# Patient Record
Sex: Male | Born: 1975
Health system: Southern US, Community
[De-identification: ages and names within clinical notes are randomized; demographics above are authoritative.]

## PROBLEM LIST (undated history)

## (undated) DIAGNOSIS — F909 Attention-deficit hyperactivity disorder, unspecified type: Secondary | ICD-10-CM

## (undated) HISTORY — PX: WISDOM TOOTH EXTRACTION: SHX21

## (undated) HISTORY — DX: Attention-deficit hyperactivity disorder, unspecified type: F90.9

---

## 2007-07-11 HISTORY — PX: REPAIR TENDONS FOOT: SUR1209

## 2007-08-09 ENCOUNTER — Emergency Department (HOSPITAL_COMMUNITY): Admission: EM | Admit: 2007-08-09 | Discharge: 2007-08-09 | Payer: Self-pay | Admitting: Emergency Medicine

## 2007-09-17 ENCOUNTER — Ambulatory Visit (HOSPITAL_BASED_OUTPATIENT_CLINIC_OR_DEPARTMENT_OTHER): Admission: RE | Admit: 2007-09-17 | Discharge: 2007-09-17 | Payer: Self-pay | Admitting: Orthopedic Surgery

## 2009-02-15 ENCOUNTER — Ambulatory Visit: Payer: Self-pay | Admitting: Family Medicine

## 2009-02-15 LAB — CONVERTED CEMR LAB
Alkaline Phosphatase: 65 units/L (ref 39–117)
BUN: 11 mg/dL (ref 6–23)
Basophils Absolute: 0 10*3/uL (ref 0.0–0.1)
Basophils Relative: 0.2 % (ref 0.0–3.0)
Bilirubin, Direct: 0.2 mg/dL (ref 0.0–0.3)
CO2: 30 meq/L (ref 19–32)
Calcium: 9.2 mg/dL (ref 8.4–10.5)
Chloride: 108 meq/L (ref 96–112)
Cholesterol: 205 mg/dL — ABNORMAL HIGH (ref 0–200)
Creatinine, Ser: 1.2 mg/dL (ref 0.4–1.5)
Direct LDL: 148.1 mg/dL
Eosinophils Absolute: 0.1 10*3/uL (ref 0.0–0.7)
Lymphocytes Relative: 36.8 % (ref 12.0–46.0)
MCHC: 34.5 g/dL (ref 30.0–36.0)
MCV: 89.8 fL (ref 78.0–100.0)
Monocytes Absolute: 0.5 10*3/uL (ref 0.1–1.0)
Neutrophils Relative %: 50.6 % (ref 43.0–77.0)
Platelets: 183 10*3/uL (ref 150.0–400.0)
RBC: 5.32 M/uL (ref 4.22–5.81)
RDW: 12.4 % (ref 11.5–14.6)
Total Bilirubin: 1.4 mg/dL — ABNORMAL HIGH (ref 0.3–1.2)
Total CHOL/HDL Ratio: 6
Total Protein: 7.2 g/dL (ref 6.0–8.3)
Triglycerides: 144 mg/dL (ref 0.0–149.0)
VLDL: 28.8 mg/dL (ref 0.0–40.0)

## 2009-03-29 ENCOUNTER — Ambulatory Visit: Payer: Self-pay | Admitting: Family Medicine

## 2009-05-13 ENCOUNTER — Ambulatory Visit: Payer: Self-pay | Admitting: Family Medicine

## 2009-06-22 IMAGING — CR DG ANKLE COMPLETE 3+V*L*
3 series · 3 of 3 positions shown · non-contrast
Comparison: none

CLINICAL DATA: Running. Left ankle injury.  Pain laterally. 
 LEFT ANKLE ? 3 VIEW:

[t ankle joint ap left (1 of 2)]
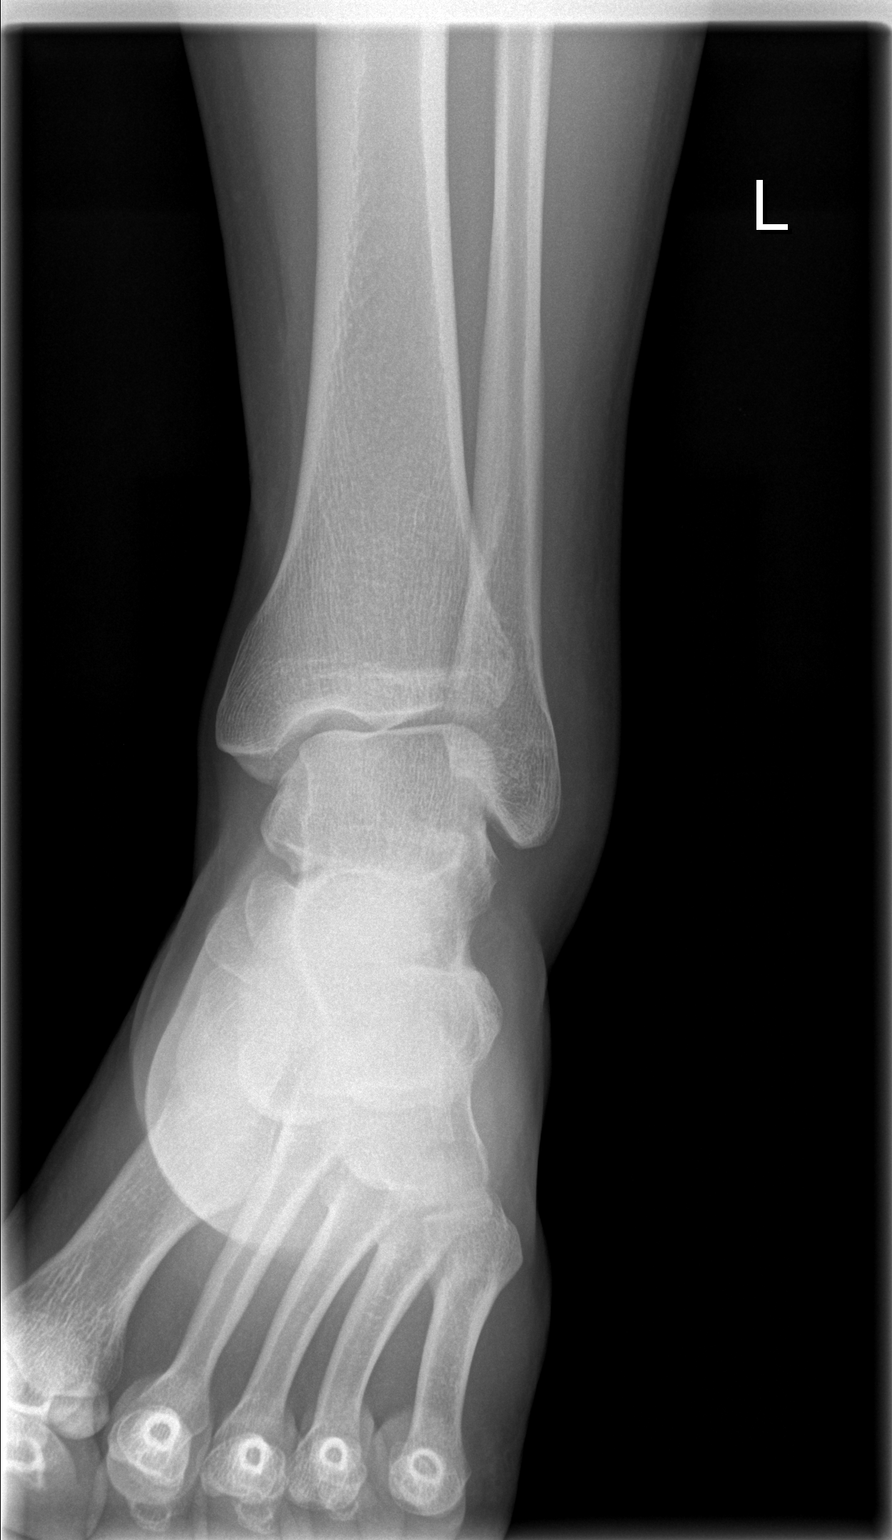

[t ankle joint ap left (2 of 2)]
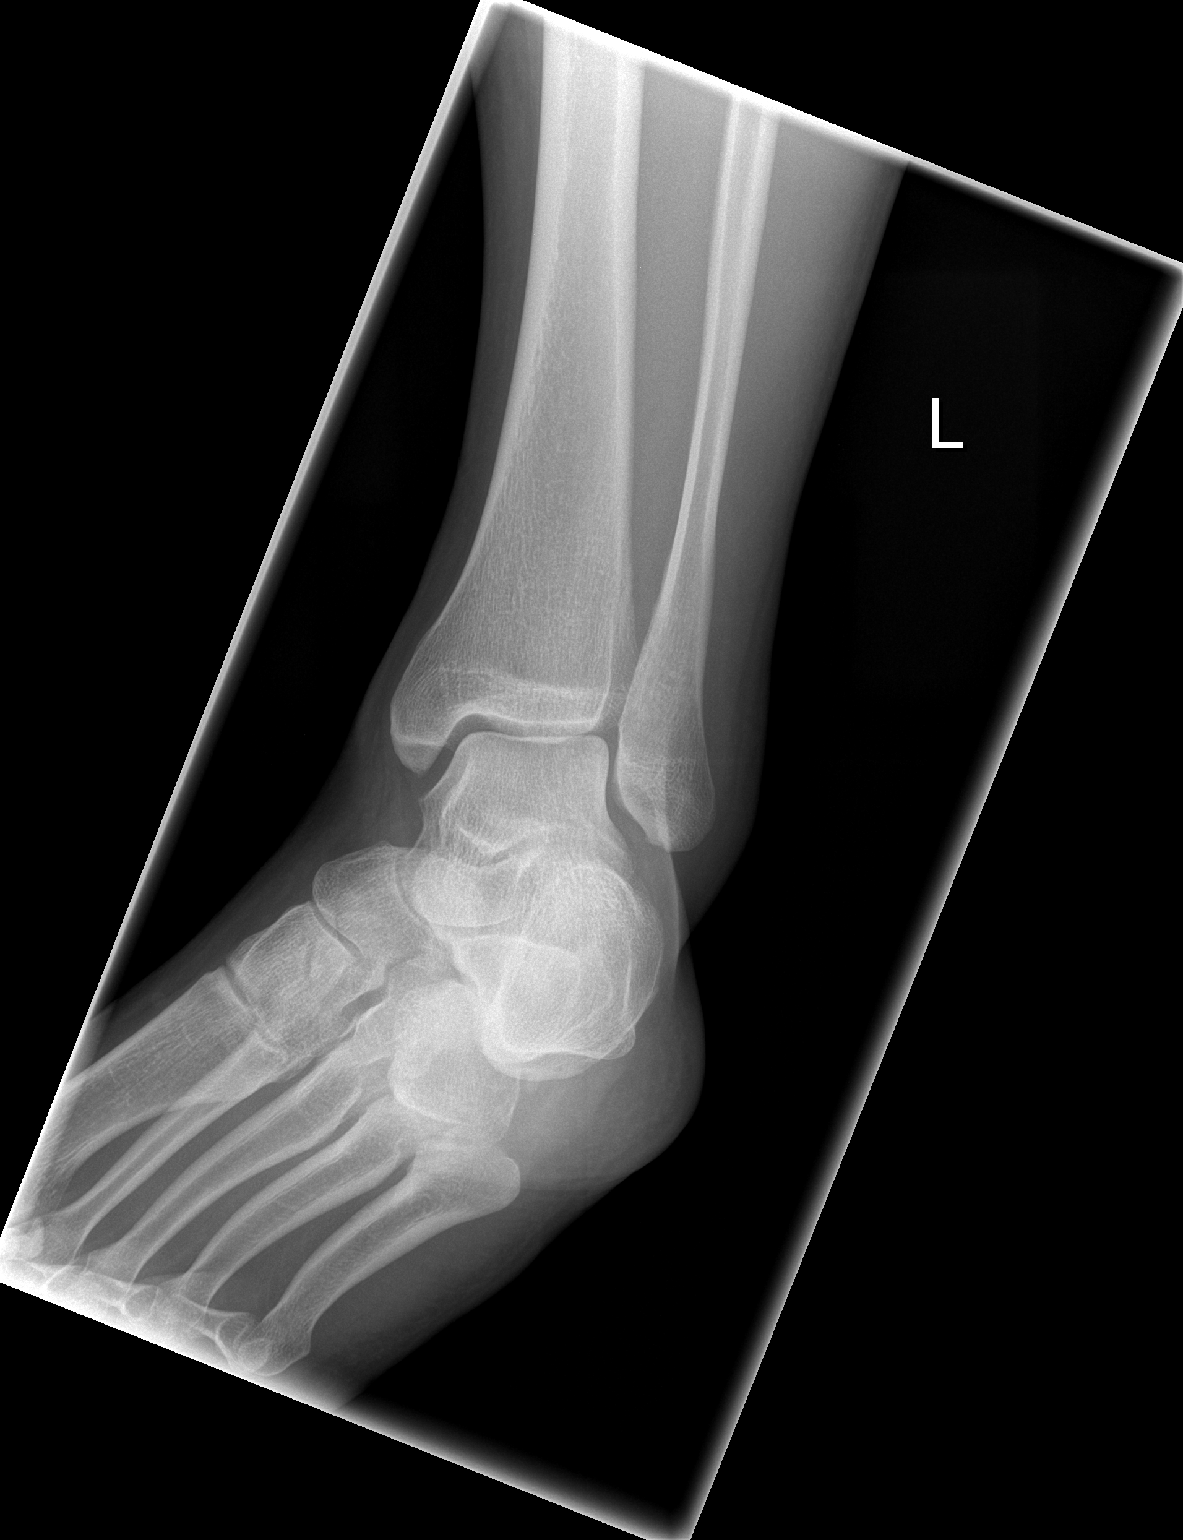

[t ankle joint lat left]
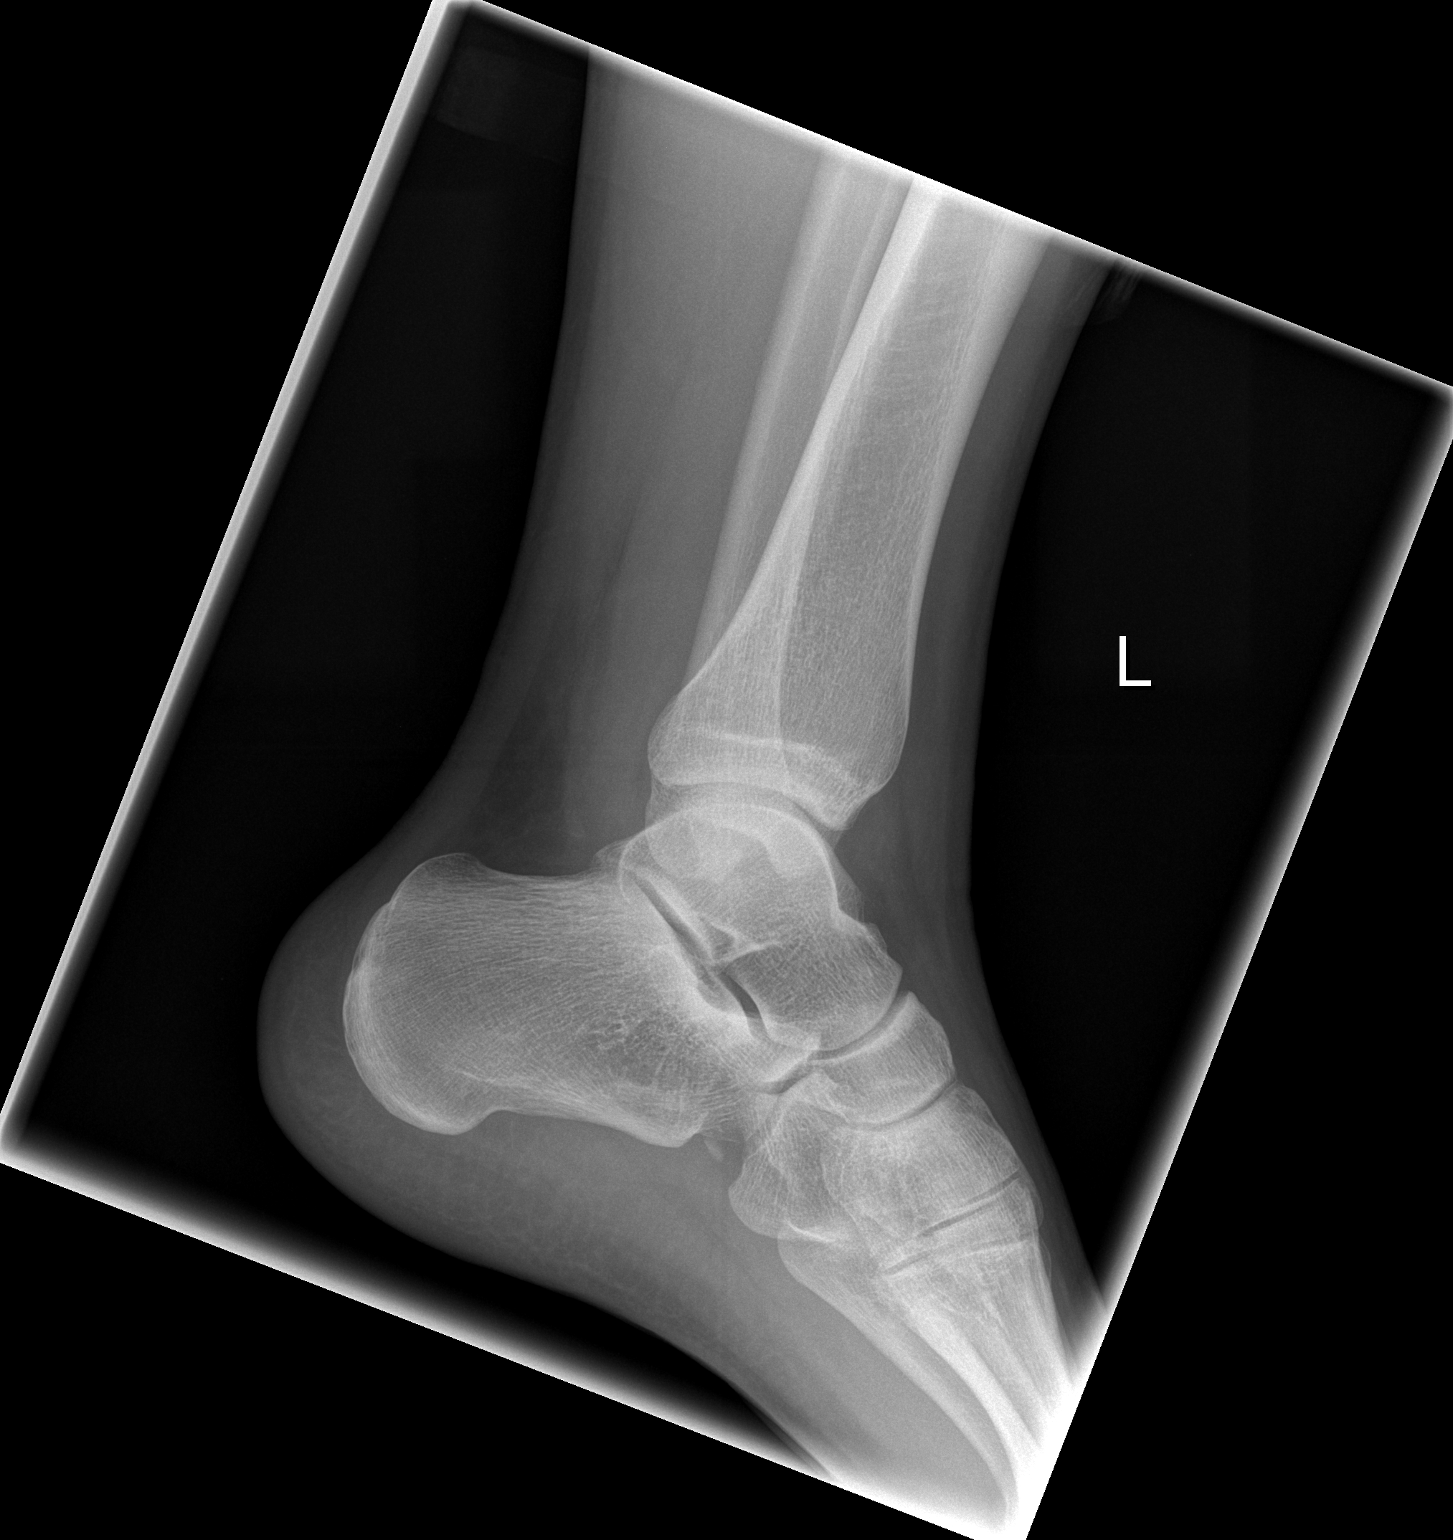

[3 of 3 positions shown; findings below may reference images not displayed]

FINDINGS: Soft tissue swelling laterally.  No fracture or dislocation. No radiopaque foreign body. Bones of the proximal foot appear intact as well.
IMPRESSION: Lateral soft tissue swelling without apparent acute bony injury.

## 2009-07-22 ENCOUNTER — Ambulatory Visit: Payer: Self-pay | Admitting: Family Medicine

## 2009-07-22 DIAGNOSIS — J029 Acute pharyngitis, unspecified: Secondary | ICD-10-CM

## 2010-08-09 NOTE — Assessment & Plan Note (Signed)
Summary: medicine for sore throat/cjr   Vital Signs:  Patient profile:   35 year old male Temp:     98.2 degrees F oral BP sitting:   118 / 82  (left arm) Cuff size:   regular  Vitals Entered By: Sid Falcon LPN (July 22, 2009 10:06 AM) CC: Sore throat, body aches X 2 days   History of Present Illness: Acute visit. Sore throat which started yesterday. Diffuse body aches intermittent headaches. Minimal nasal congestion. No cough. Severe sore throat symptoms slightly improved with Advil and Tylenol. No ill exposures. No known drug allergies.  Allergies: No Known Drug Allergies PMH reviewed for relevance  Review of Systems      See HPI  Physical Exam  General:  Well-developed,well-nourished,in no acute distress; alert,appropriate and cooperative throughout examination Ears:  External ear exam shows no significant lesions or deformities.  Otoscopic examination reveals clear canals, tympanic membranes are intact bilaterally without bulging, retraction, inflammation or discharge. Hearing is grossly normal bilaterally. Nose:  External nasal examination shows no deformity or inflammation. Nasal mucosa are pink and moist without lesions or exudates. Mouth:  extremely red erythematous posterior pharynx with some satellite type erythema. No exudate. Neck:  supple with tender anterior cervical nodes bilaterally Lungs:  Normal respiratory effort, chest expands symmetrically. Lungs are clear to auscultation, no crackles or wheezes. Heart:  Normal rate and regular rhythm. S1 and S2 normal without gallop, murmur, click, rub or other extra sounds. Skin:  no rash   Impression & Recommendations:  Problem # 1:  SORE THROAT (ICD-462) rapid strep negative but questionable reliability of swab.  Clinically this looks very suspicious for strep. We'll go ahead and treated with amoxicillin. His updated medication list for this problem includes:    Naproxen 500 Mg Tabs (Naproxen) ..... One by mouth  two times a day as needed pain    Amoxicillin 875 Mg Tabs (Amoxicillin) ..... One by mouth two times a day for 10 days  Orders: Rapid Strep ([address])  Complete Medication List: 1)  Multi Vitamin Mens Tabs (Multiple vitamin) .... Once daily 2)  Naproxen 500 Mg Tabs (Naproxen) .... One by mouth two times a day as needed pain 3)  Amoxicillin 875 Mg Tabs (Amoxicillin) .... One by mouth two times a day for 10 days  Patient Instructions: 1)  Drink lots of fluids. Continue Tylenol or Advil for symptom relief. Prescriptions: AMOXICILLIN 875 MG TABS (AMOXICILLIN) one by mouth two times a day for 10 days  #20 x 0   Entered and Authorized by:   Evelena Peat MD   Signed by:   Evelena Peat MD on 07/22/2009   Method used:   Electronically to        [facility]  [address] [phone]* (retail)       [address]       [place], [place]  [address]       Ph: [phone]       Fax: [phone]   RxID:   [id number]

## 2010-09-26 ENCOUNTER — Telehealth: Payer: Self-pay | Admitting: *Deleted

## 2010-09-26 NOTE — Telephone Encounter (Signed)
Chart reviewed. Dr. Caryl Never pt?

## 2010-09-26 NOTE — Telephone Encounter (Signed)
Pt is asking for a MD to treat his ADD.  Do yo want to do this or refer him?

## 2010-09-26 NOTE — Telephone Encounter (Signed)
THIS IS NOT DR Lovell Sheehan PT

## 2010-11-22 NOTE — Op Note (Signed)
NAMEJAPHET, Kyle Scott NO.:  [id number]   MEDICAL RECORD NO.:  [id number]          PATIENT TYPE:  AMB   LOCATION:  DSC                          FACILITY:  [facility]   PHYSICIAN:  Leonides Grills, M.D.     DATE OF BIRTH:  1975/11/09   DATE OF PROCEDURE:  09/17/2007  DATE OF DISCHARGE:                               OPERATIVE REPORT   PREOPERATIVE DIAGNOSIS:  Left subluxing peroneal tendon.   POSTOPERATIVE DIAGNOSIS:  Left subluxing peroneal tendons.   OPERATION:  Repair left subluxing peroneal tendon without fibular  osteotomy   ANESTHESIA:  General.   SURGEON:  Leonides Grills, M.D.   ASSISTANT:  Richardean Canal, P.A.   ESTIMATED BLOOD LOSS:  Minimal.   TOURNIQUET TIME:  Approximately one hour.   COMPLICATIONS:  None.   DISPOSITION:  Stable to PR.   INDICATIONS:  This is a 35 year old gentleman who sustained the above  injury.  He was consented for the above procedure.  All risks including  infection, neurovascular injury, recurrence of subluxation, persistent  pain, tenosynovitis, stiffness, scar tissue formation and prolonged  recovery were all explained; questions were encouraged and answered.   OPERATION:  The patient was brought to the operating room and placed in  the supine position, after adequate general endotracheal tube anesthesia  was administered, as well as Ancef 1 gram IV piggyback.  The patient was  then placed in the lateral decubitus position with the operative side up  on a beanbag.  An axillary roll was placed.  All bony prominences were  well padded The left lower extremity was then prepped and draped in the  sterile manner, with a proximally placed thigh tourniquet.  The limb was  gravity exsanguinated.  The tourniquet was elevated to 290 mmHg.  A  curvilinear incision over the lateral malleolus was then made.  Dissection was carried down through skin.  Hemostasis was obtained.  The  peroneus longus tendon was relocated digitally, and the  attenuated  peroneal retinaculum was then incised.  This was then reflected and the  tendons were inspected.  There was no tear of the peroneus brevis or  longus tendon.  There was a large amount of synovitis in this area and  scar tissue as well.  The peroneus brevis muscle belly was digitalized,  and this was debulked and debrided as well.   Once the formal tenosynovectomy was performed and the peroneus brevis  muscle belly was debulked, we then inspected the posterior aspect the  lateral malleolus.  There was then adequate concavity to this area.  It  was because of this we did not perform a fibular osteotomy.  We then  created a trough with a curved 0.25-inch osteotome, followed by a  rongeur on the posterolateral edge of the lateral malleolus.  Once this  was done, we then used a 2-mm drill and drilled multiple drill holes  over the lateral edge and into this trough as well.  We then used 2-0  FiberWire stitch and used multiple stitches to reapproximate and repair  the peroneal retinaculum into this trough  area.  Once this was done, and  the repair was beautifully done in this area and had an excellent bed  and tension throughout, we then repaired the peroneal retinaculum edge  to the free edge that was slightly anterior to this with 3-0 Vicryl  stitch.  Range of motion in the ankle was excellent.  The peroneal  tendon excursion was excellent as well.  There were no areas where the  tendon was bound up within the suture; for the tendons were protected  throughout the repair.   Prior to doing the retinacular closure, that area was copiously  irrigated normal saline.  The tourniquet was deflated at the end of  procedure and hemostasis was obtained.  The area was copiously irrigated  once again with normal saline.  The subcutaneous was closed with 3-0  Vicryl.  The skin was closed with 4-0 nylon.  Sterile dressing was  applied.  Modified Jones dressing was applied.  The patient was  stable  to PR.      Leonides Grills, M.D.  Electronically Signed     PB/MEDQ  D:  09/17/2007  T:  09/18/2007  Job:  [id number]

## 2011-04-03 LAB — POCT HEMOGLOBIN-HEMACUE: Hemoglobin: 16.6

## 2014-12-18 ENCOUNTER — Ambulatory Visit: Payer: Self-pay | Admitting: Family Medicine

## 2014-12-21 ENCOUNTER — Other Ambulatory Visit (HOSPITAL_COMMUNITY): Payer: BLUE CROSS/BLUE SHIELD

## 2015-03-15 ENCOUNTER — Encounter (HOSPITAL_COMMUNITY): Payer: Self-pay | Admitting: Vascular Surgery

## 2015-03-15 ENCOUNTER — Emergency Department (HOSPITAL_COMMUNITY)
Admission: EM | Admit: 2015-03-15 | Discharge: 2015-03-16 | Disposition: A | Discharge status: Home / Self Care | Payer: BLUE CROSS/BLUE SHIELD | Attending: Emergency Medicine | Admitting: Emergency Medicine

## 2015-03-15 DIAGNOSIS — R1031 Right lower quadrant pain: Secondary | ICD-10-CM | POA: Diagnosis present

## 2015-03-15 DIAGNOSIS — R110 Nausea: Secondary | ICD-10-CM | POA: Insufficient documentation

## 2015-03-15 LAB — CBC
HCT: 45.6 % (ref 39.0–52.0)
HEMOGLOBIN: 15.9 g/dL (ref 13.0–17.0)
MCH: 30.6 pg (ref 26.0–34.0)
MCHC: 34.9 g/dL (ref 30.0–36.0)
MCV: 87.7 fL (ref 78.0–100.0)
Platelets: 205 10*3/uL (ref 150–400)
RBC: 5.2 MIL/uL (ref 4.22–5.81)
RDW: 12.6 % (ref 11.5–15.5)
WBC: 7.1 10*3/uL (ref 4.0–10.5)

## 2015-03-15 LAB — URINALYSIS, ROUTINE W REFLEX MICROSCOPIC
Bilirubin Urine: NEGATIVE
GLUCOSE, UA: NEGATIVE mg/dL
HGB URINE DIPSTICK: NEGATIVE
Ketones, ur: NEGATIVE mg/dL
LEUKOCYTES UA: NEGATIVE
Nitrite: NEGATIVE
PH: 5 (ref 5.0–8.0)
Protein, ur: NEGATIVE mg/dL
Specific Gravity, Urine: 1.033 — ABNORMAL HIGH (ref 1.005–1.030)
Urobilinogen, UA: 0.2 mg/dL (ref 0.0–1.0)

## 2015-03-15 LAB — COMPREHENSIVE METABOLIC PANEL
ALBUMIN: 4.4 g/dL (ref 3.5–5.0)
ALK PHOS: 66 U/L (ref 38–126)
ALT: 48 U/L (ref 17–63)
ANION GAP: 9 (ref 5–15)
AST: 42 U/L — ABNORMAL HIGH (ref 15–41)
BUN: 21 mg/dL — ABNORMAL HIGH (ref 6–20)
CALCIUM: 9.3 mg/dL (ref 8.9–10.3)
CHLORIDE: 104 mmol/L (ref 101–111)
CO2: 25 mmol/L (ref 22–32)
CREATININE: 1.24 mg/dL (ref 0.61–1.24)
GFR calc non Af Amer: >60 mL/min (ref >60)
GLUCOSE: 114 mg/dL — AB (ref 65–99)
Potassium: 4.1 mmol/L (ref 3.5–5.1)
SODIUM: 138 mmol/L (ref 135–145)
Total Bilirubin: 0.9 mg/dL (ref 0.3–1.2)
Total Protein: 7.3 g/dL (ref 6.5–8.1)

## 2015-03-15 MED ORDER — SODIUM CHLORIDE 0.9 % IV BOLUS (SEPSIS)
1000.0000 mL | Freq: Once | INTRAVENOUS | Status: AC
  Administered 2015-03-16: 1000 mL via INTRAVENOUS

## 2015-03-15 NOTE — ED Notes (Signed)
Pt reports to the ED for eval of RLQ abd pain. He reports he had this pain this am and then it resolved and then it came back tonight. Pt denies nausea but no V/D, fevers, chills, or urinary symptoms. Pt has not had any abd surgeries. Pt A&Ox4, resp e/u, and skin warm and dry.

## 2015-03-15 NOTE — ED Provider Notes (Signed)
TIME SEEN: 11:31 PM   CHIEF COMPLAINT: Abdominal Pain   HPI: Kyle Scott is a 39 y.o. male who presents to the Emergency Department complaining of sharp, intermittent, worsening, non-radiating RLQ abdominal pain onset 8 AM this morning, and then again at 8:00 PM tonight. He states he is not currently in pain. He reports associated nausea. He notes mild relief of the pain with curling up into fetal position. He has no other medical problems. He denies any previous abdominal surgery. Pt denies fever, vomiting, diarrhea, urinary symptoms, bloody stool, and penile discharge, testicular pain or swelling. Reports symptoms have completely resolved upon arrival to the emergency department.   Pt has NKDA.  ROS: See HPI Constitutional: no fever  Eyes: no drainage  ENT: no runny nose   Cardiovascular:  no chest pain  Resp: no SOB  GI: no vomiting GU: no dysuria Integumentary: no rash  Allergy: no hives  Musculoskeletal: no leg swelling  Neurological: no slurred speech ROS otherwise negative  PAST MEDICAL HISTORY/PAST SURGICAL HISTORY:  History reviewed. No pertinent past medical history.  MEDICATIONS:  Prior to Admission medications   Not on File    ALLERGIES:  No Known Allergies  SOCIAL HISTORY:  Social History  Substance Use Topics  . Smoking status: Never Smoker   . Smokeless tobacco: Never Used  . Alcohol Use: No    FAMILY HISTORY: No family history on file.  EXAM: BP 131/75 mmHg  Pulse 83  Temp(Src) 97.9 F (36.6 C) (Oral)  Resp 16  Ht  (1.753 m)  Wt 201 lb 4.8 oz (91.309 kg)  BMI 29.71 kg/m2  SpO2 100% CONSTITUTIONAL: Alert and oriented and responds appropriately to questions. Well-appearing; well-nourished HEAD: Normocephalic EYES: Conjunctivae clear, PERRL ENT: normal nose; no rhinorrhea; moist mucous membranes; pharynx without lesions noted NECK: Supple, no meningismus, no LAD  CARD: RRR; S1 and S2 appreciated; no murmurs, no clicks, no rubs, no  gallops RESP: Normal chest excursion without splinting or tachypnea; breath sounds clear and equal bilaterally; no wheezes, no rhonchi, no rales, no hypoxia or respiratory distress, speaking full sentences ABD/GI: Normal bowel sounds; non-distended; soft, tender at McBurney's point with voluntary guarding, negative Murphy's sign, no rebound, no peritoneal signs BACK:  The back appears normal and is non-tender to palpation, there is no CVA tenderness EXT: Normal ROM in all joints; non-tender to palpation; no edema; normal capillary refill; no cyanosis, no calf tenderness or swelling    SKIN: Normal color for age and race; warm NEURO: Moves all extremities equally, sensation to light touch intact diffusely, cranial nerves II through XII intact PSYCH: The patient's mood and manner are appropriate. Grooming and personal hygiene are appropriate.  MEDICAL DECISION MAKING: Patient here with right lower quadrant pain. Pain is been intermittent. He denies being currently but does have some tenderness with palpation at McBurney's point. Discussed with patient that a suspicion for this being appendicitis was low but he states this was his largest concern. States that he had a family member with symptoms similar to his and that is why he came to the emergency department. No history of kidney stones. Labs are unremarkable including no leukocytosis. No fever. Urine shows no sign of infection or blood. Given patient's tenderness at McBurney's point with guarding will obtain CT scan for further evaluation. He declines any pain medication at this time.  ED PROGRESS: CT scan shows no acute abnormality. Appendix appears normal. He does have a mass in the lateral segment of the left  lobe of the liver that is consistent with a cavernous hemangioma. He is still pain-free. Able to drink without difficulty. I feel he is safe to be discharged home without further emergent workup. Discussed with patient unclear etiology for his  pain but if it returns and is more persistent or he has fever, persistent vomiting, blood in his stool, black and tarry stools to return to the emergency department. He does have a PCP for follow-up. He verbalizes understanding and is comfortable with this plan.   I personally performed the services described in this documentation, which was scribed in my presence. The recorded information has been reviewed and is accurate.   Layla Maw Issai Werling, DO 03/16/15 [phone]

## 2015-03-16 ENCOUNTER — Emergency Department (HOSPITAL_COMMUNITY): Payer: BLUE CROSS/BLUE SHIELD

## 2015-03-16 ENCOUNTER — Encounter (HOSPITAL_COMMUNITY): Payer: Self-pay | Admitting: Radiology

## 2015-03-16 MED ORDER — IOHEXOL 300 MG/ML  SOLN
100.0000 mL | Freq: Once | INTRAMUSCULAR | Status: AC | PRN
  Administered 2015-03-16: 100 mL via INTRAVENOUS

## 2015-03-16 NOTE — Discharge Instructions (Signed)

## 2016-05-23 ENCOUNTER — Ambulatory Visit (INDEPENDENT_AMBULATORY_CARE_PROVIDER_SITE_OTHER): Payer: BLUE CROSS/BLUE SHIELD | Admitting: Family Medicine

## 2016-05-23 ENCOUNTER — Encounter: Payer: Self-pay | Admitting: Family Medicine

## 2016-05-23 VITALS — BP 132/78 | HR 104 | Temp 98.2°F | Ht 69.0 in | Wt 197.0 lb

## 2016-05-23 DIAGNOSIS — Z79899 Other long term (current) drug therapy: Secondary | ICD-10-CM | POA: Diagnosis not present

## 2016-05-23 DIAGNOSIS — Z23 Encounter for immunization: Secondary | ICD-10-CM

## 2016-05-23 NOTE — Progress Notes (Signed)
Subjective:     Patient ID: Kyle LennertJared M Kinner, male   DOB: 08/23/1975, 40 y.o.   MRN: [id number]  HPI Patient is seen to reestablish care. He has not been seen in this practice now on over 3 years. He is followed by psychiatry and currently takes Vyvanse for adult ADD. His psychiatrist had suggested that he get an EKG though he's had no recent chest pains or exertional symptoms or dyspnea whatsoever. No family history of premature CAD. Patient is nonsmoker. He has noted occasional palpitations when taking Vyvanse but these are infrequent.  No past medical history on file. Past Surgical History:  Procedure Laterality Date  . REPAIR TENDONS FOOT Left 2009  . WISDOM TOOTH EXTRACTION      reports that he has never smoked. He has never used smokeless tobacco. He reports that he does not drink alcohol or use drugs. family history is not on file. No Known Allergies   Review of Systems  Constitutional: Negative for fatigue.  Eyes: Negative for visual disturbance.  Respiratory: Negative for cough, chest tightness and shortness of breath.   Cardiovascular: Negative for chest pain, palpitations and leg swelling.  Neurological: Negative for dizziness, syncope, weakness, light-headedness and headaches.       Objective:   Physical Exam  Constitutional: He is oriented to person, place, and time. He appears well-developed and well-nourished.  HENT:  Right Ear: External ear normal.  Left Ear: External ear normal.  Mouth/Throat: Oropharynx is clear and moist.  Eyes: Pupils are equal, round, and reactive to light.  Neck: Neck supple. No thyromegaly present.  Cardiovascular: Normal rate, regular rhythm and normal heart sounds.  Exam reveals no gallop and no friction rub.   No murmur heard. Pulmonary/Chest: Effort normal and breath sounds normal. No respiratory distress. He has no wheezes. He has no rales.  Musculoskeletal: He exhibits no edema.  Neurological: He is alert and oriented to person, place,  and time.       Assessment:     #1 history of adult ADD on stimulant medication.  No history of CAD and low risk.    Plan:     -We'll check EKG as per psychiatry request.  EKG shows NSR with no acute changes.  Kristian CoveyBruce W Shaterra Sanzone MD [facility]

## 2016-09-12 MED FILL — VYVANSE 50 MG CAPSULE: 50 | 30 days supply | Qty: 30 | Fill #0

## 2017-01-27 IMAGING — CT CT ABD-PELV W/ CM
2 of 4 series · 16 of 46 positions shown, 18 images · IV contrast (Omni 300)
Comparison: None.

CLINICAL DATA: Right lower quadrant abdominal pain.  Nausea.

EXAM:
CT ABDOMEN AND PELVIS WITH CONTRAST
TECHNIQUE: Multidetector CT imaging of the abdomen and pelvis was performed
using the standard protocol following bolus administration of
intravenous contrast.
CONTRAST:  100mL OMNIPAQUE IOHEXOL 300 MG/ML  SOLN

[Series 2: abd/ pelvis 5.0 i30f 1 · axial · 0.78mm/px · z∈[-528,-43]mm · 13 of 107 slices shown, 15 images]
[im 5/107  soft-tissue]
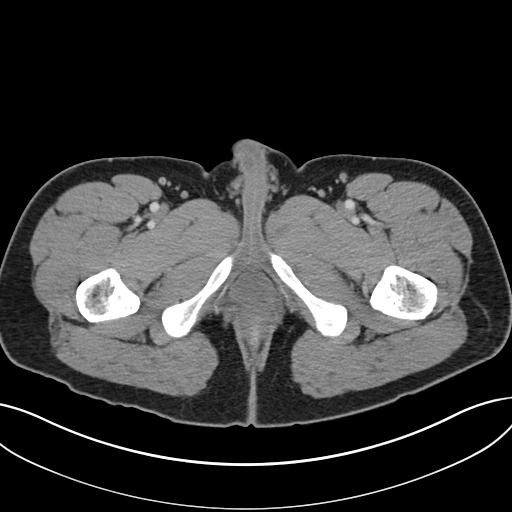
[im 5/107  bone]
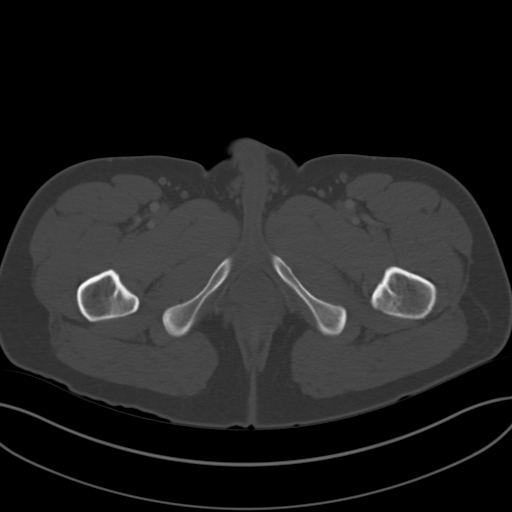
[im 14/107  soft-tissue]
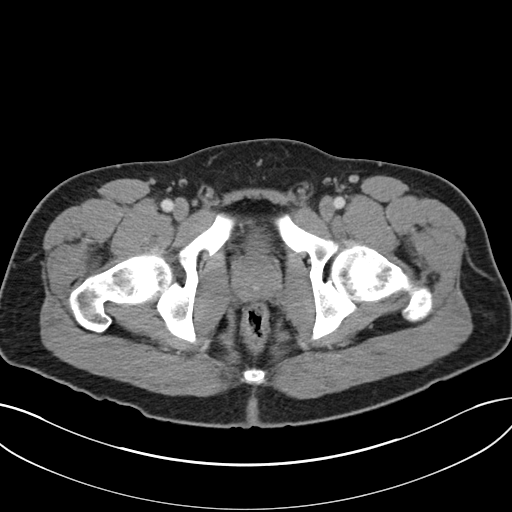
[im 24/107  soft-tissue]
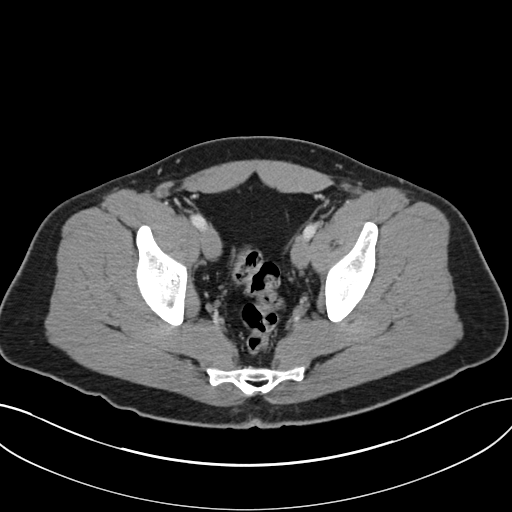
[im 28/107  soft-tissue]
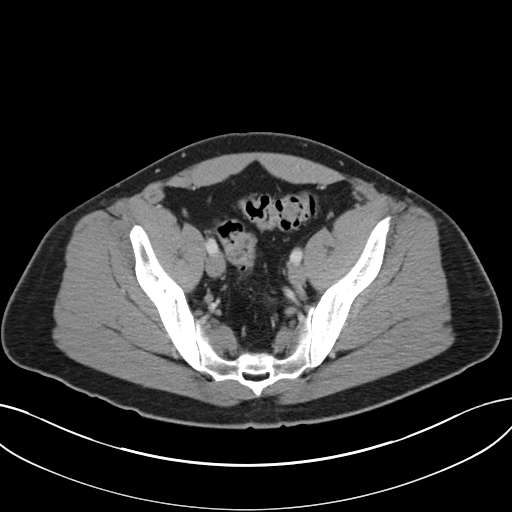
[im 37/107  soft-tissue]
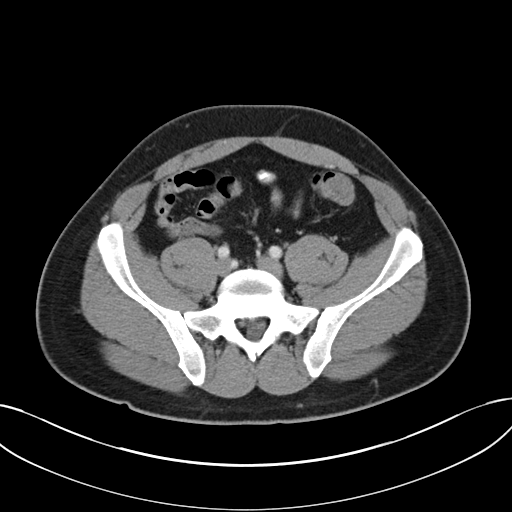
[im 47/107  soft-tissue]
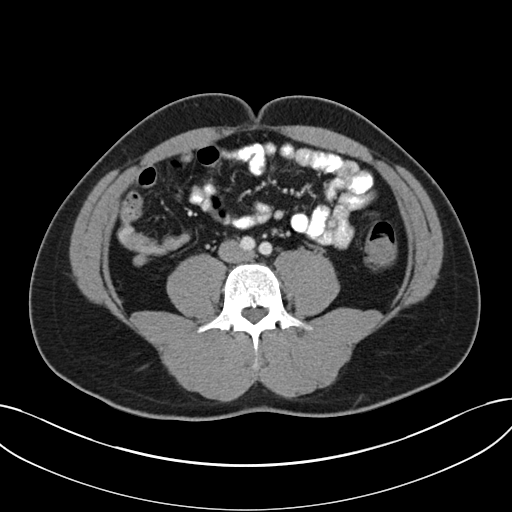
[im 56/107  soft-tissue]
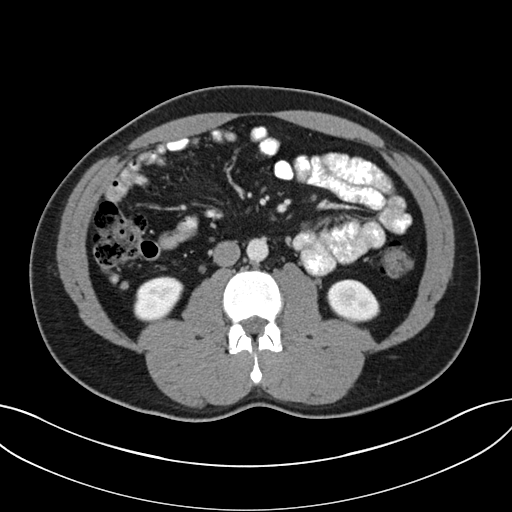
[im 60/107  soft-tissue]
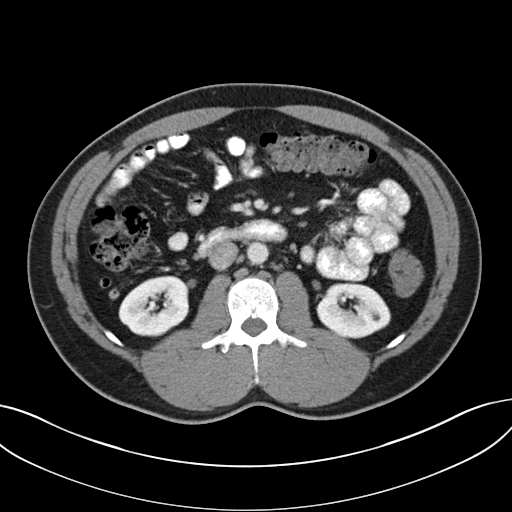
[im 70/107  soft-tissue]
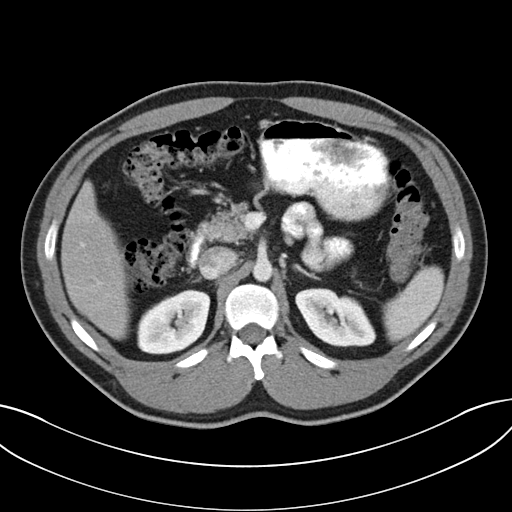
[im 70/107  bone]
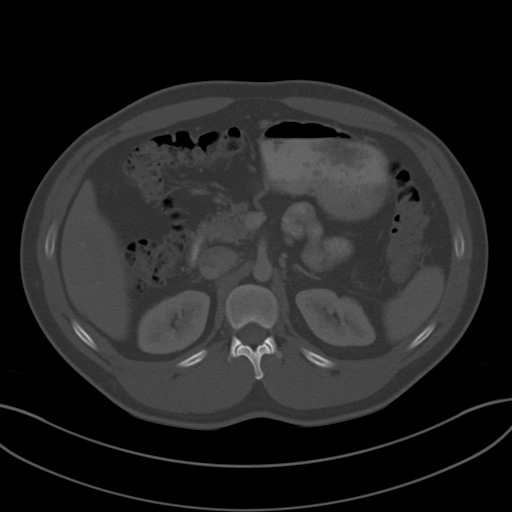
[im 79/107  soft-tissue]
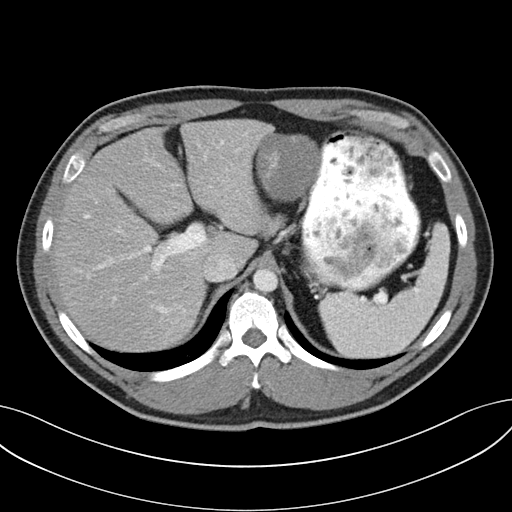
[im 83/107  soft-tissue]
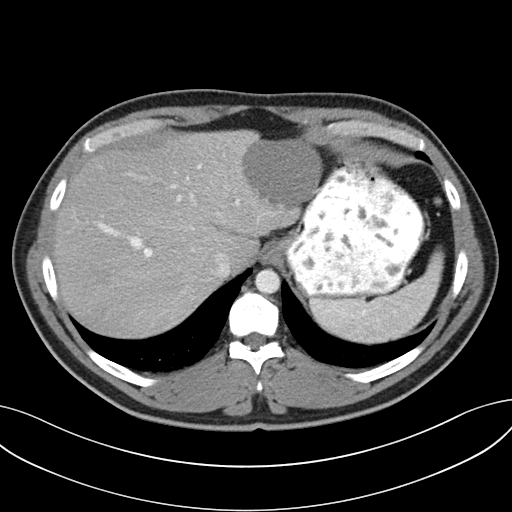
[im 93/107  soft-tissue]
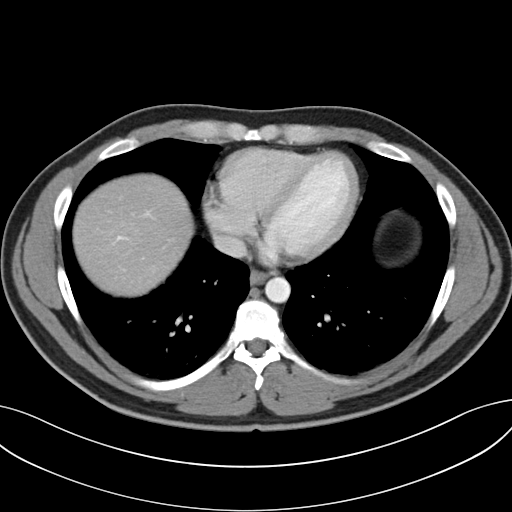
[im 102/107  soft-tissue]
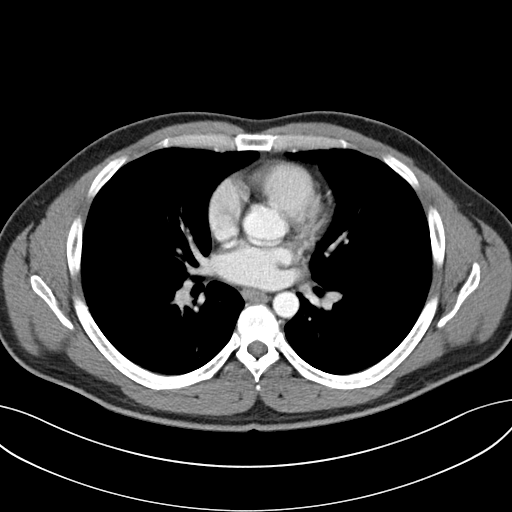

[Series 5: coronals · coronal · 0.76mm/px · 3 of 118 slices shown]
[im 40/118  soft-tissue]
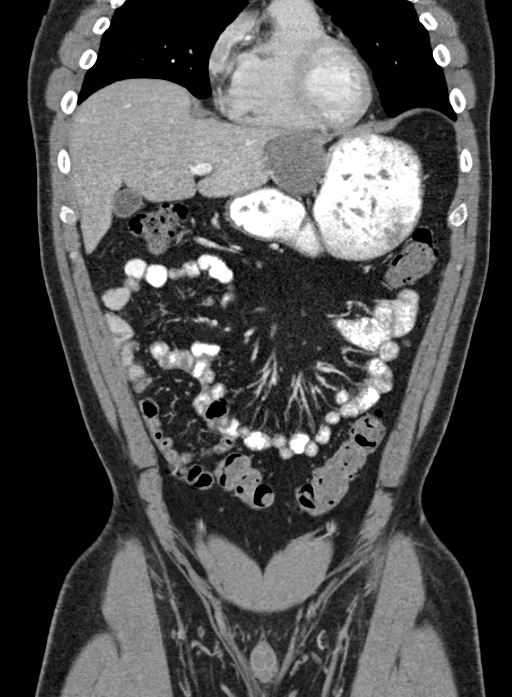
[im 53/118  soft-tissue]
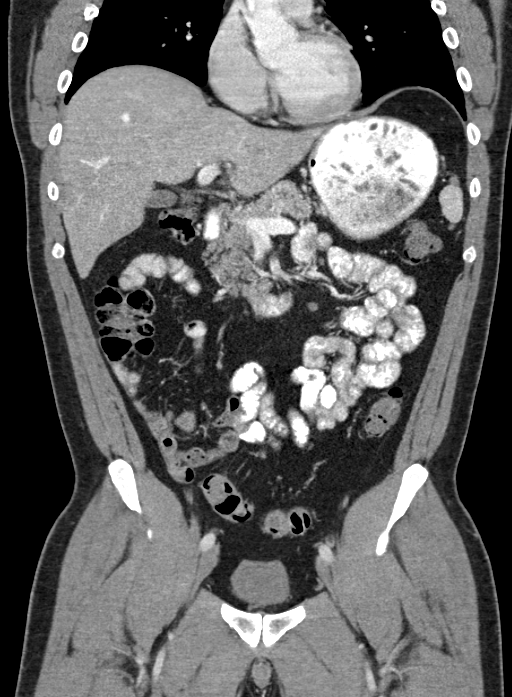
[im 66/118  soft-tissue]
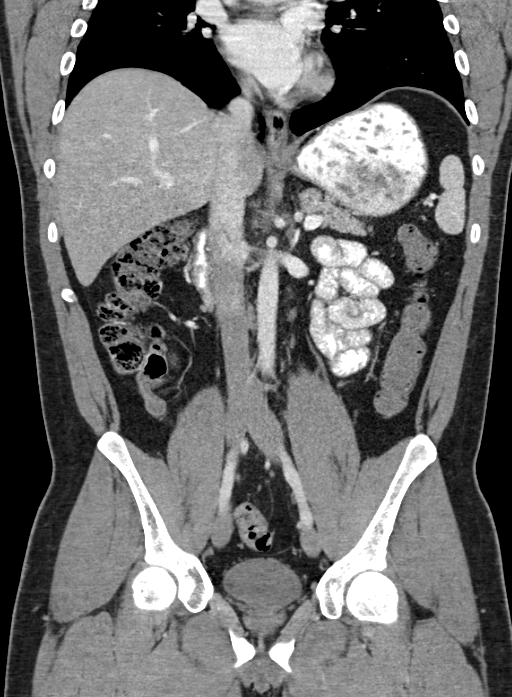

[16 of 46 positions shown; findings below may reference images not displayed]

FINDINGS: Atelectasis in the lung bases.

Low-attenuation lesion in the lateral segment left lobe of liver
measures 5.8 cm diameter. There is peripheral nodular enhancement.
The enhancement pattern suggests cavernous hemangioma. No other
focal liver lesions identified. Gallbladder, spleen, pancreas,
adrenal glands, kidneys, abdominal aorta, inferior vena cava, and
retroperitoneal lymph nodes are unremarkable. Small accessory
spleens. Stomach, small bowel, and colon are not abnormally
distended. Stomach is filled with food. Colon is stool filled. No
bowel wall thickening is appreciated. No free air or free fluid in
the abdomen.

Pelvis: The appendix is normal. No free or loculated pelvic fluid
collections. No pelvic mass or lymphadenopathy. Prostate gland is
not enlarged. Bladder wall is not thickened. Scattered diverticula
in the sigmoid colon. No evidence of diverticulitis. No destructive
bone lesions.
IMPRESSION: No acute process demonstrated in the abdomen or pelvis. No evidence
of bowel obstruction or inflammation. Appendix is normal. Mass in
the lateral segment left lobe of the liver has enhancement pattern
consistent with cavernous hemangioma.

## 2018-03-22 ENCOUNTER — Encounter: Payer: Self-pay | Admitting: Family Medicine

## 2018-03-22 ENCOUNTER — Ambulatory Visit (INDEPENDENT_AMBULATORY_CARE_PROVIDER_SITE_OTHER): Payer: BLUE CROSS/BLUE SHIELD | Admitting: Family Medicine

## 2018-03-22 VITALS — BP 138/90 | HR 90 | Temp 98.1°F | Wt 197.1 lb

## 2018-03-22 DIAGNOSIS — M5441 Lumbago with sciatica, right side: Secondary | ICD-10-CM

## 2018-03-22 DIAGNOSIS — Z23 Encounter for immunization: Secondary | ICD-10-CM

## 2018-03-22 DIAGNOSIS — Z7189 Other specified counseling: Secondary | ICD-10-CM

## 2018-03-22 DIAGNOSIS — Z7184 Encounter for health counseling related to travel: Secondary | ICD-10-CM

## 2018-03-22 MED ORDER — ATOVAQUONE-PROGUANIL HCL 250-100 MG PO TABS: ORAL_TABLET | ORAL | 0 refills | Status: DC

## 2018-03-22 MED ORDER — PREDNISONE 10 MG PO TABS: ORAL_TABLET | ORAL | 0 refills | Status: DC

## 2018-03-22 MED ORDER — TYPHOID VACCINE PO CPDR: DELAYED_RELEASE_CAPSULE | ORAL | 0 refills | Status: DC

## 2018-03-22 NOTE — Progress Notes (Signed)
  Subjective:     Patient ID: Kyle Scott, male   DOB: 11/10/1975, 42 y.o.   MRN: [id number]  HPI Patient seen with onset of back pain week ago radiating from his right lower lumbar area into the buttock and occasionally down below the knee. He describes a sharp pain. Somewhat worse with ambulation. No injury. No numbness. No weakness. No urine or stool incontinence. No prior history of back difficulties. Pain has been somewhat progressive. He's tried ibuprofen 600 mg without relief. Some relief with back extension  Second issue is upcoming travel with his work to [place]. He thinks he might have had one hepatitis A but is not sure.He does not recall any prior history of typhoid vaccination. His tetanus will be due next year.  No past medical history on file. Past Surgical History:  Procedure Laterality Date  . REPAIR TENDONS FOOT Left 2009  . WISDOM TOOTH EXTRACTION      reports that he has never smoked. He has never used smokeless tobacco. He reports that he does not drink alcohol or use drugs. family history is not on file. No Known Allergies   Review of Systems  Constitutional: Negative for activity change, appetite change and fever.  Respiratory: Negative for cough and shortness of breath.   Cardiovascular: Negative for chest pain and leg swelling.  Gastrointestinal: Negative for abdominal pain and vomiting.  Genitourinary: Negative for dysuria, flank pain and hematuria.  Musculoskeletal: Positive for back pain. Negative for joint swelling.  Neurological: Negative for weakness and numbness.       Objective:   Physical Exam  Constitutional: He is oriented to person, place, and time. He appears well-developed and well-nourished. No distress.  Neck: No thyromegaly present.  Cardiovascular: Normal rate, regular rhythm and normal heart sounds.  No murmur heard. Pulmonary/Chest: Effort normal and breath sounds normal. No respiratory distress. He has no wheezes. He has no rales.   Musculoskeletal: He exhibits no edema.  Neurological: He is alert and oriented to person, place, and time. He has normal reflexes. No cranial nerve deficit.  Skin: No rash noted.       Assessment:     #1 right lower back pain with sciatica type symptoms with nonfocal neuro exam   #2Travel medicine advice encounter    Plan:     -prednisone taper prescribed -review back extension stretches -Hepatitis A vaccine given -Vivotif one every other day for 4 doses -Malarone 250 mg 1 daily starting 1 day prior to travel, during travel, and for 1 week after return -discussed importance of safe water and food intake/precautions.Kristian Covey.  Rashee Marschall W Cirilo Canner MD [facility]

## 2018-03-22 NOTE — Patient Instructions (Signed)
Sciatica Sciatica is pain, numbness, weakness, or tingling along the path of the sciatic nerve. The sciatic nerve starts in the lower back and runs down the back of each leg. The nerve controls the muscles in the lower leg and in the back of the knee. It also provides feeling (sensation) to the back of the thigh, the lower leg, and the sole of the foot. Sciatica is a symptom of another medical condition that pinches or puts pressure on the sciatic nerve. Generally, sciatica only affects one side of the body. Sciatica usually goes away on its own or with treatment. In some cases, sciatica may keep coming back (recur). What are the causes? This condition is caused by pressure on the sciatic nerve, or pinching of the sciatic nerve. This may be the result of:  A disk in between the bones of the spine (vertebrae) bulging out too far (herniated disk).  Age-related changes in the spinal disks (degenerative disk disease).  A pain disorder that affects a muscle in the buttock (piriformis syndrome).  Extra bone growth (bone spur) near the sciatic nerve.  An injury or break (fracture) of the pelvis.  Pregnancy.  Tumor (rare).  What increases the risk? The following factors may make you more likely to develop this condition:  Playing sports that place pressure or stress on the spine, such as football or weight lifting.  Having poor strength and flexibility.  A history of back injury.  A history of back surgery.  Sitting for long periods of time.  Doing activities that involve repetitive bending or lifting.  Obesity.  What are the signs or symptoms? Symptoms can vary from mild to very severe, and they may include:  Any of these problems in the lower back, leg, hip, or buttock: ? Mild tingling or dull aches. ? Burning sensations. ? Sharp pains.  Numbness in the back of the calf or the sole of the foot.  Leg weakness.  Severe back pain that makes movement difficult.  These  symptoms may get worse when you cough, sneeze, or laugh, or when you sit or stand for long periods of time. Being overweight may also make symptoms worse. In some cases, symptoms may recur over time. How is this diagnosed? This condition may be diagnosed based on:  Your symptoms.  A physical exam. Your health care provider may ask you to do certain movements to check whether those movements trigger your symptoms.  You may have tests, including: ? Blood tests. ? X-rays. ? MRI. ? CT scan.  How is this treated? In many cases, this condition improves on its own, without any treatment. However, treatment may include:  Reducing or modifying physical activity during periods of pain.  Exercising and stretching to strengthen your abdomen and improve the flexibility of your spine.  Icing and applying heat to the affected area.  Medicines that help: ? To relieve pain and swelling. ? To relax your muscles.  Injections of medicines that help to relieve pain, irritation, and inflammation around the sciatic nerve (steroids).  Surgery.  Follow these instructions at home: Medicines  Take over-the-counter and prescription medicines only as told by your health care provider.  Do not drive or operate heavy machinery while taking prescription pain medicine. Managing pain  If directed, apply ice to the affected area. ? Put ice in a plastic bag. ? Place a towel between your skin and the bag. ? Leave the ice on for 20 minutes, 2-3 times a day.  After icing, apply   heat to the affected area before you exercise or as often as told by your health care provider. Use the heat source that your health care provider recommends, such as a moist heat pack or a heating pad. ? Place a towel between your skin and the heat source. ? Leave the heat on for 20-30 minutes. ? Remove the heat if your skin turns bright red. This is especially important if you are unable to feel pain, heat, or cold. You may have a  greater risk of getting burned. Activity  Return to your normal activities as told by your health care provider. Ask your health care provider what activities are safe for you. ? Avoid activities that make your symptoms worse.  Take brief periods of rest throughout the day. Resting in a lying or standing position is usually better than sitting to rest. ? When you rest for longer periods, mix in some mild activity or stretching between periods of rest. This will help to prevent stiffness and pain. ? Avoid sitting for long periods of time without moving. Get up and move around at least one time each hour.  Exercise and stretch regularly, as told by your health care provider.  Do not lift anything that is heavier than 10 lb (4.5 kg) while you have symptoms of sciatica. When you do not have symptoms, you should still avoid heavy lifting, especially repetitive heavy lifting.  When you lift objects, always use proper lifting technique, which includes: ? Bending your knees. ? Keeping the load close to your body. ? Avoiding twisting. General instructions  Use good posture. ? Avoid leaning forward while sitting. ? Avoid hunching over while standing.  Maintain a healthy weight. Excess weight puts extra stress on your back and makes it difficult to maintain good posture.  Wear supportive, comfortable shoes. Avoid wearing high heels.  Avoid sleeping on a mattress that is too soft or too hard. A mattress that is firm enough to support your back when you sleep may help to reduce your pain.  Keep all follow-up visits as told by your health care provider. This is important. Contact a health care provider if:  You have pain that wakes you up when you are sleeping.  You have pain that gets worse when you lie down.  Your pain is worse than you have experienced in the past.  Your pain lasts longer than 4 weeks.  You experience unexplained weight loss. Get help right away if:  You lose control  of your bowel or bladder (incontinence).  You have: ? Weakness in your lower back, pelvis, buttocks, or legs that gets worse. ? Redness or swelling of your back. ? A burning sensation when you urinate. This information is not intended to replace advice given to you by your health care provider. Make sure you discuss any questions you have with your health care provider. Document Released: 06/20/2001 Document Revised: 11/30/2015 Document Reviewed: 03/05/2015 Elsevier Interactive Patient Education  2018 [employer]vier [employer]  

## 2018-08-06 ENCOUNTER — Ambulatory Visit (INDEPENDENT_AMBULATORY_CARE_PROVIDER_SITE_OTHER): Payer: 59 | Admitting: Psychiatry

## 2018-08-06 DIAGNOSIS — F909 Attention-deficit hyperactivity disorder, unspecified type: Secondary | ICD-10-CM

## 2018-08-06 MED ORDER — LISDEXAMFETAMINE DIMESYLATE 50 MG PO CAPS: 50.0000 mg | ORAL_CAPSULE | Freq: Every day | ORAL | 0 refills | Status: DC

## 2018-08-06 NOTE — Progress Notes (Signed)
[facility]  Patient ID: Kyle Scott,  MRN: [id number]  PCP: Kristian Covey, MD  Date of Evaluation: 08/06/2018 Time spent:20 minutes  Chief Complaint:   HISTORY/CURRENT STATUS: HPI continues to do well.  Individual Medical History/ Review of Systems: Changes? :No   Allergies: Patient has no known allergies.  Current Medications:  Current Outpatient Medications:  .  atovaquone-proguanil (MALARONE) 250-100 MG TABS tablet, Take one tablet daily starting one day prior to travel, during travel, and for one week after return, Disp: 14 tablet, Rfl: 0 .  lisdexamfetamine (VYVANSE) 50 MG capsule, Take 50 mg by mouth daily., Disp: , Rfl:  .  Multiple Vitamin (MULTIVITAMIN) tablet, Take 1 tablet by mouth daily., Disp: , Rfl:  .  predniSONE (DELTASONE) 10 MG tablet, Taper as follows:  4-4-4-4-3-3-2-2-1-1, Disp: 28 tablet, Rfl: 0 .  typhoid (VIVOTIF) DR capsule, Take one every other day for 4 doses, Disp: 4 capsule, Rfl: 0 Medication Side Effects: none  Family Medical/ Social History: Changes? No  MENTAL HEALTH EXAM:  There were no vitals taken for this visit.There is no height or weight on file to calculate BMI.  General Appearance: Casual  Eye Contact:  Good  Speech:  Clear and Coherent  Volume:  Normal  Mood:  Euthymic  Affect:  Appropriate  Thought Process:  Linear  Orientation:  Full (Time, Place, and Person)  Thought Content: Logical   Suicidal Thoughts:  No  Homicidal Thoughts:  No  Memory:  WNL  Judgement:  Good  Insight:  Good  Psychomotor Activity:  Normal  Concentration:  Concentration: Good  Recall:  Good  Fund of Knowledge: Good  Language: Good  Assets:  Desire for Improvement  ADL's:  Intact  Cognition: WNL  Prognosis:  Good   158/95, pulse 94 DIAGNOSES: No diagnosis found.  Receiving Psychotherapy: No    RECOMMENDATIONS:  Patient is currently doing well he is to continue his Vyvanse 50 mg a day.  I am concerned about his blood  pressure today.  Times in the past is been in normal range.  Today is 158/95 with a pulse of 94.  Patient is to check his vital signs 2 times a week and call his family doctor if they are elevated and call me to.  Patient is to return in 3 months  Anne Fu, [place]

## 2018-08-13 ENCOUNTER — Emergency Department: Payer: 59

## 2018-08-13 ENCOUNTER — Encounter: Payer: Self-pay | Admitting: Emergency Medicine

## 2018-08-13 ENCOUNTER — Telehealth: Payer: Self-pay | Admitting: *Deleted

## 2018-08-13 ENCOUNTER — Emergency Department
Admission: EM | Admit: 2018-08-13 | Discharge: 2018-08-13 | Disposition: A | Discharge status: Home / Self Care | Payer: 59 | Attending: Emergency Medicine | Admitting: Emergency Medicine

## 2018-08-13 DIAGNOSIS — F909 Attention-deficit hyperactivity disorder, unspecified type: Secondary | ICD-10-CM | POA: Insufficient documentation

## 2018-08-13 DIAGNOSIS — R079 Chest pain, unspecified: Secondary | ICD-10-CM | POA: Diagnosis present

## 2018-08-13 DIAGNOSIS — Z79899 Other long term (current) drug therapy: Secondary | ICD-10-CM | POA: Diagnosis not present

## 2018-08-13 LAB — CBC
HEMATOCRIT: 46.5 % (ref 39.0–52.0)
HEMOGLOBIN: 16 g/dL (ref 13.0–17.0)
MCH: 30 pg (ref 26.0–34.0)
MCHC: 34.4 g/dL (ref 30.0–36.0)
MCV: 87.2 fL (ref 80.0–100.0)
Platelets: 215 10*3/uL (ref 150–400)
RBC: 5.33 MIL/uL (ref 4.22–5.81)
RDW: 12.2 % (ref 11.5–15.5)
WBC: 4.9 10*3/uL (ref 4.0–10.5)
nRBC: 0 % (ref 0.0–0.2)

## 2018-08-13 LAB — BASIC METABOLIC PANEL
Anion gap: 6 (ref 5–15)
BUN: 17 mg/dL (ref 6–20)
CHLORIDE: 105 mmol/L (ref 98–111)
CO2: 27 mmol/L (ref 22–32)
CREATININE: 1.08 mg/dL (ref 0.61–1.24)
Calcium: 8.9 mg/dL (ref 8.9–10.3)
GFR calc non Af Amer: >60 mL/min (ref >60)
Glucose, Bld: 111 mg/dL — ABNORMAL HIGH (ref 70–99)
POTASSIUM: 3.7 mmol/L (ref 3.5–5.1)
SODIUM: 138 mmol/L (ref 135–145)

## 2018-08-13 LAB — TROPONIN I

## 2018-08-13 MED ORDER — ASPIRIN 81 MG PO CHEW
324.0000 mg | CHEWABLE_TABLET | Freq: Once | ORAL | Status: AC
  Administered 2018-08-13: 324 mg via ORAL
  Filled 2018-08-13: qty 4

## 2018-08-13 NOTE — ED Provider Notes (Signed)
[facility] Emergency Department Provider Note  ____________________________________________  Time seen: Approximately 7:41 AM  I have reviewed the triage vital signs and the nursing notes.   HISTORY  Chief Complaint Chest Pain and Numbness   HPI Kyle Scott is a 43 y.o. male with history of ADHD who presents for evaluation of chest pain.  Patient reports he was driving to work when he developed sharp 7/10 left-sided chest pain that radiated down his left arm.  He reports that his left hand felt cold.  The pain lasted 5 minutes and resolved with no intervention.  No diaphoresis, nausea, vomiting, shortness of breath.  Patient has no other past medical history, no family history of heart attacks, he is not a smoker.  The pain has now resolved.  He denies ever having similar pain.  No personal or family history of blood clots, recent travel immobilization, leg pain or swelling, hemoptysis, or exogenous hormones.  PMH ADHD  Patient Active Problem List   Diagnosis Date Noted  . SORE THROAT 07/22/2009    Past Surgical History:  Procedure Laterality Date  . REPAIR TENDONS FOOT Left 2009  . WISDOM TOOTH EXTRACTION      Prior to Admission medications   Medication Sig Start Date End Date Taking? Authorizing Provider  lisdexamfetamine (VYVANSE) 50 MG capsule Take 1 capsule (50 mg total) by mouth daily. 08/06/18  Yes Shugart, Mat Carne, PA-C  Multiple Vitamin (MULTIVITAMIN) tablet Take 1 tablet by mouth daily.   Yes [provider]    Allergies Patient has no known allergies.  FH No history of   Social History Social History   Tobacco Use  . Smoking status: Never Smoker  . Smokeless tobacco: Never Used  Substance Use Topics  . Alcohol use: No  . Drug use: No    Review of Systems  Constitutional: Negative for fever. Eyes: Negative for visual changes. ENT: Negative for sore throat. Neck: No neck pain  Cardiovascular: + chest pain, L hand  cold Respiratory: Negative for shortness of breath. Gastrointestinal: Negative for abdominal pain, vomiting or diarrhea. Genitourinary: Negative for dysuria. Musculoskeletal: Negative for back pain. Skin: Negative for rash. Neurological: Negative for headaches, weakness or numbness. Psych: No SI or HI  ____________________________________________   PHYSICAL EXAM:  VITAL SIGNS: ED Triage Vitals [08/13/18 0712]  Enc Vitals Group     BP 126/75     Pulse Rate 88     Resp 20     Temp 98 F (36.7 C)     Temp Source Oral     SpO2 97 %     Weight 190 lb (86.2 kg)     Height 5\' 9"  (1.753 m)     Head Circumference      Peak Flow      Pain Score 1     Pain Loc      Pain Edu?      Excl. in GC?     Constitutional: Alert and oriented. Well appearing and in no apparent distress. HEENT:      Head: Normocephalic and atraumatic.         Eyes: Conjunctivae are normal. Sclera is non-icteric.       Mouth/Throat: Mucous membranes are moist.       Neck: Supple with no signs of meningismus. Cardiovascular: Regular rate and rhythm. No murmurs, gallops, or rubs. 2+ symmetrical distal pulses are present in all extremities. No JVD. Respiratory: Normal respiratory effort. Lungs are clear to auscultation bilaterally. No  wheezes, crackles, or rhonchi.  Gastrointestinal: Soft, non tender, and non distended with positive bowel sounds. No rebound or guarding. Musculoskeletal: Nontender with normal range of motion in all extremities. No edema, cyanosis, or erythema of extremities. Neurologic: Normal speech and language. Face is symmetric. Moving all extremities. No gross focal neurologic deficits are appreciated. Skin: Skin is warm, dry and intact. No rash noted. Psychiatric: Mood and affect are normal. Speech and behavior are normal.  ____________________________________________   LABS (all labs ordered are listed, but only abnormal results are displayed)  Labs Reviewed  BASIC METABOLIC PANEL -  Abnormal; Notable for the following components:      Result Value   Glucose, Bld 111 (*)    All other components within normal limits  CBC  TROPONIN I  TROPONIN I   ____________________________________________  EKG  ED ECG REPORT I, Nita Sicklearolina Timea Breed, the attending physician, personally viewed and interpreted this ECG.  Normal sinus rhythm, rate of 79, normal intervals, normal axis, no ST elevations or depressions.  Normal EKG.  Unchanged from prior from 2017. ____________________________________________  RADIOLOGY  I have personally reviewed the images performed during this visit and I agree with the Radiologist's read.   Interpretation by Radiologist:  Dg Chest 2 View  Result Date: 08/13/2018 CLINICAL DATA:  Chest pain EXAM: CHEST - 2 VIEW COMPARISON:  None. FINDINGS: Lungs are clear. Heart size and pulmonary vascularity are normal. No adenopathy. No pneumothorax. No bone lesions. IMPRESSION: No edema or consolidation. Electronically Signed   By: Bretta BangWilliam  Woodruff III M.D.   On: 08/13/2018 07:51      ____________________________________________   PROCEDURES  Procedure(s) performed: None Procedures Critical Care performed:  None ____________________________________________   INITIAL IMPRESSION / ASSESSMENT AND PLAN / ED COURSE   43 y.o. male with history of ADHD who presents for evaluation of a brief episode of sharp left sided chest pain associated with coldness of the L hand. Symptoms now resolved. Equal pulses and similar BP bilaterally, EKG non ischemic, normal cap refill and neuro exam.  Ddx ACS vs radicular pain. PERC negative. Aortic dissection considered, however there were with no typical symptoms of chest pain radiating through to intrascapular back, no severe hypertension, symmetric bilateral radial pulses, no associated neurologic deficits, no associated abdominal or lower extremity symptoms, no marfanoid features or evidence of underlying connective tissue  disorder. Therefore if chest x-ray is without evidence of mediastinal widening, will not pursue further diagnosis at this time.  Plan for troponin x 2 q3hrs, labs, CXR. Will monitor on telemetry.   Clinical Course as of Aug 13 1125  Tue Aug 13, 2018  1127 Patient remains with no further episodes of chest pain.  Troponin x2 is negative.  Patient has been referred to cardiology for follow-up.  Discussed standard return precautions.   [CV]    Clinical Course User Index [CV] Don PerkingVeronese, [place], MD     As part of my medical decision making, I reviewed the following data within the electronic MEDICAL RECORD NUMBER Nursing notes reviewed and incorporated, Labs reviewed , EKG interpreted , Old chart reviewed, Radiograph reviewed , Notes from prior ED visits and [place] Controlled Substance Database    Pertinent labs & imaging results that were available during my care of the patient were reviewed by me and considered in my medical decision making (see chart for details).    ____________________________________________   FINAL CLINICAL IMPRESSION(S) / ED DIAGNOSES  Final diagnoses:  Chest pain, unspecified type      NEW MEDICATIONS  STARTED DURING THIS VISIT:  ED Discharge Orders    None       Note:  This document was prepared using Dragon voice recognition software and may include unintentional dictation errors.    Don PerkingVeronese, [place], MD 08/13/18 1128

## 2018-08-13 NOTE — Telephone Encounter (Signed)
OK to refer.

## 2018-08-13 NOTE — ED Triage Notes (Signed)
Pt reports about 30 minutes ago started with a sharp chest pain to the left side of his chest that branched out. Pt states that he also has some numbness to his left hand and felt like it got cold. Pt denies SOB, nausea or other sx's. Pt reports sharp pain is gone and now it feels sore.

## 2018-08-13 NOTE — Telephone Encounter (Signed)
Copied from CRM [id number]. Topic: Referral - Request for Referral >> Aug 13, 2018  3:23 PM Lynne Logan D wrote: Has patient seen PCP for this complaint? No *If NO, is insurance requiring patient see PCP for this issue before PCP can refer them? Referral for which specialty: Cardiology Preferred provider/office: [facility] Reason for referral: Pt went to the ED for chest pain. It was recommended that he see a cardiologist.

## 2018-08-13 NOTE — Discharge Instructions (Signed)

## 2018-08-13 NOTE — Telephone Encounter (Signed)
Please advise if OK to place referral

## 2018-08-14 ENCOUNTER — Other Ambulatory Visit: Payer: Self-pay

## 2018-08-14 DIAGNOSIS — R079 Chest pain, unspecified: Secondary | ICD-10-CM

## 2018-08-14 NOTE — Telephone Encounter (Signed)
Referral has been ordered

## 2018-08-14 NOTE — Telephone Encounter (Signed)
Called patient and LMOVM.  Ok for [facility] to Discuss results / PCP / recommendations / Schedule patient  Cardiology referral has been ordered for the patient.

## 2018-09-01 NOTE — Progress Notes (Signed)
Cardiology Office Note   Date:  09/02/2018   ID:  Kyle Scott, DOB 17-Feb-1976, MRN [id number]  PCP:  Kristian Covey, MD  Cardiologist:   No primary care provider on file. Referring:  Kristian Covey, MD  No chief complaint on file.     History of Present Illness: Kyle Scott is a 43 y.o. male who presents for evaluation of chest pain.  He was in the ED for this earlier this month.  I reviewed these records for this visit.     The patient has no prior cardiac history.  He was in the emergency room because he had some chest discomfort.  He was driving.  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  He developed some sharp discomfort that was in his left chest and shooting into his axilla.  He then have some left arm numbness.  He had some soreness in his chest that lasted until he got into the emergency room and beyond.  This was 3 out of 10 in intensity.  He was not having any radiation to his jaw.  He was not having any new shortness of breath.  He has no PND or orthopnea.  He was not having any palpitations, presyncope or syncope.  EKG was unremarkable.  Troponins were negative.  He was treated only with aspirin.  He never had symptoms like this before.  He is never had any cardiovascular testing.  He does not exercise because he is driving back and forth to work so much.  He has 5 children.  He does have emotional stress at work.   Past Medical History:  Diagnosis Date  . ADHD     Past Surgical History:  Procedure Laterality Date  . REPAIR TENDONS FOOT Left 2009  . WISDOM TOOTH EXTRACTION       Current Outpatient Medications  Medication Sig Dispense Refill  . lisdexamfetamine (VYVANSE) 50 MG capsule Take 1 capsule (50 mg total) by mouth daily. 30 capsule 0  . Multiple Vitamin (MULTIVITAMIN) tablet Take 1 tablet by mouth daily.     No current facility-administered medications for this visit.     Allergies:   Patient has no known allergies.     Social History:  The patient  reports that he has never smoked. He has never used smokeless tobacco. He reports that he does not drink alcohol or use drugs.   Family History:  The patient's family history includes Hypothyroidism in his mother; Prostate cancer in his father.    ROS:  Please see the history of present illness.   Otherwise, review of systems are positive for none.   All other systems are reviewed and negative.    PHYSICAL EXAM: VS:  BP 120/76 Comment: RIGHT  Pulse 91   Ht 5\' 9"  (1.753 m)   Wt 196 lb (88.9 kg)   BMI 28.94 kg/m  , BMI Body mass index is 28.94 kg/m. GENERAL:  Well appearing HEENT:  Pupils equal round and reactive, fundi not visualized, oral mucosa unremarkable NECK:  No jugular venous distention, waveform within normal limits, carotid upstroke brisk and symmetric, no bruits, no thyromegaly LYMPHATICS:  No cervical, inguinal adenopathy LUNGS:  Clear to auscultation bilaterally BACK:  No CVA tenderness CHEST:  Unremarkable HEART:  PMI not displaced or sustained,S1 and S2 within normal limits, no S3, no S4, no clicks, no rubs, no murmurs ABD:  Flat, positive bowel sounds normal in frequency  in pitch, no bruits, no rebound, no guarding, no midline pulsatile mass, no hepatomegaly, no splenomegaly EXT:  2 plus pulses throughout, no edema, no cyanosis no clubbing SKIN:  No rashes no nodules NEURO:  Cranial nerves II through XII grossly intact, motor grossly intact throughout PSYCH:  Cognitively intact, oriented to person place and time    EKG:  EKG is ordered today. The ekg ordered today demonstrates sinus rhythm, rate 91, axis within normal limits, intervals within normal limits, no acute ST-T wave changes.   Recent Labs: 08/13/2018: BUN 17; Creatinine, Ser 1.08; Hemoglobin 16.0; Platelets 215; Potassium 3.7; Sodium 138    Lipid Panel   Wt Readings from Last 3 Encounters:  09/02/18 196 lb (88.9 kg)  08/13/18 190 lb (86.2 kg)  03/22/18 197 lb 2  oz (89.4 kg)      Other studies Reviewed: Additional studies/ records that were reviewed today include: ED records. Review of the above records demonstrates:  Please see elsewhere in the note.     ASSESSMENT AND PLAN:  CHEST PAIN:   Chest pain is atypical.  He has no significant cardiovascular risk factors other than grandparents who had early onset heart disease.  Because of this family history and the discomfort I would like to bring him and start with a coronary calcium score.  This will help [place] determine goals of therapy as well.  DYSLIPIDEMIA: His last LDL was about 9 years ago and was 148.  He will come back for fasting lipid profile.  Goals of therapy will be based on this profile.  RISK REDUCTION: We talked at length about exercise and diet.  I suggested the Mediterranean diet and gave him a prescription for exercise.   Current medicines are reviewed at length with the patient today.  The patient does not have concerns regarding medicines.  The following changes have been made:    Labs/ tests ordered today include:   Orders Placed This Encounter  Procedures  . CT CARDIAC SCORING  . Lipid panel  . EKG 12-Lead     Disposition:   FU with me as needed.      Signed, Rollene Rotunda, MD  09/02/2018 12:29 PM    [facility]

## 2018-09-02 ENCOUNTER — Encounter: Payer: Self-pay | Admitting: Cardiology

## 2018-09-02 ENCOUNTER — Ambulatory Visit (INDEPENDENT_AMBULATORY_CARE_PROVIDER_SITE_OTHER): Payer: 59 | Admitting: Cardiology

## 2018-09-02 VITALS — BP 120/76 | HR 91 | Ht 69.0 in | Wt 196.0 lb

## 2018-09-02 DIAGNOSIS — E785 Hyperlipidemia, unspecified: Secondary | ICD-10-CM

## 2018-09-02 DIAGNOSIS — Z79899 Other long term (current) drug therapy: Secondary | ICD-10-CM

## 2018-09-02 DIAGNOSIS — R079 Chest pain, unspecified: Secondary | ICD-10-CM | POA: Diagnosis not present

## 2018-09-02 NOTE — Patient Instructions (Addendum)
Medication Instructions:  None Ordered  If you need a refill on your cardiac medications before your next appointment, please call your pharmacy.  Labwork: Fasting Lipid HERE IN OUR OFFICE AT [facility]  You will need to fast. DO NOT EAT OR DRINK PAST MIDNIGHT.      Take the provided lab slips with you to the lab for your blood draw.   When you have your labs (blood work) drawn today and your tests are completely normal, you will receive your results only by MyChart Message (if you have MyChart) -OR-  A paper copy in the mail.  If you have any lab test that is abnormal or we need to change your treatment, we will call you to review these results.  Testing/Procedures: Your physician has requested that you have a Coronary Calcium Score Test. This test is done at our [facility].  Special Instructions:  Exercise:  150 minutes per week of moderate exercise or 75 minutes of vigorous exercise (HR goal for vigorous between 130 - 150)   Follow-Up: . Your physician recommends that you schedule a follow-up appointment in: As Needed  Mediterranean Diet A Mediterranean diet refers to food and lifestyle choices that are based on the traditions of countries located on the [employer]. This way of eating has been shown to help prevent certain conditions and improve outcomes for people who have chronic diseases, like kidney disease and heart disease. What are tips for following this plan? Lifestyle  Cook and eat meals together with your family, when possible.  Drink enough fluid to keep your urine clear or pale yellow.  Be physically active every day. This includes: ? Aerobic exercise like running or swimming. ? Leisure activities like gardening, walking, or housework.  Get 7-8 hours of sleep each night.  If recommended by your health care provider, drink red wine in moderation. This means 1 glass a day for nonpregnant women and 2 glasses a day for men. A glass of wine equals 5 oz  (150 mL). Reading food labels   Check the serving size of packaged foods. For foods such as rice and pasta, the serving size refers to the amount of cooked product, not dry.  Check the total fat in packaged foods. Avoid foods that have saturated fat or trans fats.  Check the ingredients list for added sugars, such as corn syrup. Shopping  At the grocery store, buy most of your food from the areas near the walls of the store. This includes: ? Fresh fruits and vegetables (produce). ? Grains, beans, nuts, and seeds. Some of these may be available in unpackaged forms or large amounts (in bulk). ? Fresh seafood. ? Poultry and eggs. ? Low-fat dairy products.  Buy whole ingredients instead of prepackaged foods.  Buy fresh fruits and vegetables in-season from local farmers markets.  Buy frozen fruits and vegetables in resealable bags.  If you do not have access to quality fresh seafood, buy precooked frozen shrimp or canned fish, such as tuna, salmon, or sardines.  Buy small amounts of raw or cooked vegetables, salads, or olives from the deli or salad bar at your store.  Stock your pantry so you always have certain foods on hand, such as olive oil, canned tuna, canned tomatoes, rice, pasta, and beans. Cooking  Cook foods with extra-virgin olive oil instead of using butter or other vegetable oils.  Have meat as a side dish, and have vegetables or grains as your main dish. This means having meat in  small portions or adding small amounts of meat to foods like pasta or stew.  Use beans or vegetables instead of meat in common dishes like chili or lasagna.  Experiment with different cooking methods. Try roasting or broiling vegetables instead of steaming or sauteing them.  Add frozen vegetables to soups, stews, pasta, or rice.  Add nuts or seeds for added healthy fat at each meal. You can add these to yogurt, salads, or vegetable dishes.  Marinate fish or vegetables using olive oil,  lemon juice, garlic, and fresh herbs. Meal planning   Plan to eat 1 vegetarian meal one day each week. Try to work up to 2 vegetarian meals, if possible.  Eat seafood 2 or more times a week.  Have healthy snacks readily available, such as: ? Vegetable sticks with hummus. ? [place] yogurt. ? Fruit and nut trail mix.  Eat balanced meals throughout the week. This includes: ? Fruit: 2-3 servings a day ? Vegetables: 4-5 servings a day ? Low-fat dairy: 2 servings a day ? Fish, poultry, or lean meat: 1 serving a day ? Beans and legumes: 2 or more servings a week ? Nuts and seeds: 1-2 servings a day ? Whole grains: 6-8 servings a day ? Extra-virgin olive oil: 3-4 servings a day  Limit red meat and sweets to only a few servings a month What are my food choices?  Mediterranean diet ? Recommended ? Grains: Whole-grain pasta. Brown rice. Bulgar wheat. Polenta. Couscous. Whole-wheat bread. Orpah Cobb. ? Vegetables: Artichokes. Beets. Broccoli. Cabbage. Carrots. Eggplant. Green beans. Chard. Kale. Spinach. Onions. Leeks. Peas. Squash. Tomatoes. Peppers. Radishes. ? Fruits: Apples. Apricots. Avocado. Berries. Bananas. Cherries. Dates. Figs. Grapes. Lemons. Melon. Oranges. Peaches. Plums. Pomegranate. ? Meats and other protein foods: Beans. Almonds. Sunflower seeds. Pine nuts. Peanuts. Cod. Salmon. Scallops. Shrimp. Tuna. Tilapia. Clams. Oysters. Eggs. ? Dairy: Low-fat milk. Cheese. Greek yogurt. ? Beverages: Water. Red wine. Herbal tea. ? Fats and oils: Extra virgin olive oil. Avocado oil. Grape seed oil. ? Sweets and desserts: [place] yogurt with honey. Baked apples. Poached pears. Trail mix. ? Seasoning and other foods: Basil. Cilantro. Coriander. Cumin. Mint. Parsley. Sage. Rosemary. Tarragon. Garlic. Oregano. Thyme. Pepper. Balsalmic vinegar. Tahini. Hummus. Tomato sauce. Olives. Mushrooms. ? Limit these ? Grains: Prepackaged pasta or rice dishes. Prepackaged cereal with added  sugar. ? Vegetables: Deep fried potatoes (french fries). ? Fruits: Fruit canned in syrup. ? Meats and other protein foods: Beef. Pork. Lamb. Poultry with skin. Hot dogs. Tomasa Blase. ? Dairy: Ice cream. Sour cream. Whole milk. ? Beverages: Juice. Sugar-sweetened soft drinks. Beer. Liquor and spirits. ? Fats and oils: Butter. Canola oil. Vegetable oil. Beef fat (tallow). Lard. ? Sweets and desserts: Cookies. Cakes. Pies. Candy. ? Seasoning and other foods: Mayonnaise. Premade sauces and marinades. ? The items listed may not be a complete list. Talk with your dietitian about what dietary choices are right for you. Summary  The Mediterranean diet includes both food and lifestyle choices.  Eat a variety of fresh fruits and vegetables, beans, nuts, seeds, and whole grains.  Limit the amount of red meat and sweets that you eat.  Talk with your health care provider about whether it is safe for you to drink red wine in moderation. This means 1 glass a day for nonpregnant women and 2 glasses a day for men. A glass of wine equals 5 oz (150 mL). This information is not intended to replace advice given to you by your health care provider. Make sure you discuss  any questions you have with your health care provider. Document Released: 02/17/2016 Document Revised: 03/21/2016 Document Reviewed: 02/17/2016 Elsevier Interactive Patient Education  2019 [employer].  Follow-Up: . Your physician recommends that you schedule a follow-up appointment in: As Needed   At [facility], you and your health needs are our priority.  As part of our continuing mission to provide you with exceptional heart care, we have created designated Provider Care Teams.  These Care Teams include your primary Cardiologist (physician) and Advanced Practice Providers (APPs -  Physician Assistants and Nurse Practitioners) who all work together to provide you with the care you need, when you need it.  Thank you for choosing [facility]!!

## 2018-09-09 ENCOUNTER — Other Ambulatory Visit: Payer: 59

## 2018-09-16 ENCOUNTER — Ambulatory Visit (INDEPENDENT_AMBULATORY_CARE_PROVIDER_SITE_OTHER)
Admission: RE | Admit: 2018-09-16 | Discharge: 2018-09-16 | Disposition: A | Discharge status: Home / Self Care | Payer: Self-pay | Source: Ambulatory Visit | Attending: Cardiology | Admitting: Cardiology

## 2018-09-16 ENCOUNTER — Encounter (INDEPENDENT_AMBULATORY_CARE_PROVIDER_SITE_OTHER): Payer: Self-pay

## 2018-09-16 DIAGNOSIS — R079 Chest pain, unspecified: Secondary | ICD-10-CM

## 2018-10-25 ENCOUNTER — Ambulatory Visit: Payer: 59 | Admitting: Psychiatry

## 2018-10-31 ENCOUNTER — Encounter: Payer: Self-pay | Admitting: *Deleted

## 2018-11-13 ENCOUNTER — Ambulatory Visit: Payer: 59 | Admitting: Psychiatry

## 2018-11-14 ENCOUNTER — Telehealth: Payer: Self-pay | Admitting: Cardiology

## 2018-11-14 NOTE — Telephone Encounter (Signed)
New Messages          Patient is returning someone's call, no documentation  On who called.

## 2018-11-15 ENCOUNTER — Other Ambulatory Visit: Payer: Self-pay | Admitting: Psychiatry

## 2018-11-15 ENCOUNTER — Telehealth: Payer: Self-pay | Admitting: Psychiatry

## 2018-11-15 MED ORDER — LISDEXAMFETAMINE DIMESYLATE 50 MG PO CAPS: 50.0000 mg | ORAL_CAPSULE | Freq: Every day | ORAL | 0 refills | Status: DC

## 2018-11-15 NOTE — Telephone Encounter (Signed)
Kyle Scott's Patient need refill on Vyvanse 50 mg., [facility] [address] [facility] [place].  Scheduled follow up with Shanda Bumps on 05/14

## 2018-11-15 NOTE — Progress Notes (Signed)
Pt needs schedule FU.  Schedule with Dr. Marlyne Beards for ADD fu by June

## 2018-11-21 ENCOUNTER — Other Ambulatory Visit: Payer: Self-pay

## 2018-11-21 ENCOUNTER — Ambulatory Visit (INDEPENDENT_AMBULATORY_CARE_PROVIDER_SITE_OTHER): Payer: 59 | Admitting: Psychiatry

## 2018-11-21 ENCOUNTER — Encounter: Payer: Self-pay | Admitting: Psychiatry

## 2018-11-21 DIAGNOSIS — F909 Attention-deficit hyperactivity disorder, unspecified type: Secondary | ICD-10-CM

## 2018-11-21 NOTE — Progress Notes (Signed)
Kyle Scott [id number] 1975/12/06 43 y.o.  Virtual Visit via Video Note  I connected with@ on 11/21/18 at  3:00 PM EDT by a video enabled telemedicine application and verified that I am speaking with the correct person using two identifiers.   I discussed the limitations of evaluation and management by telemedicine and the availability of in person appointments. The patient expressed understanding and agreed to proceed.  I discussed the assessment and treatment plan with the patient. The patient was provided an opportunity to ask questions and all were answered. The patient agreed with the plan and demonstrated an understanding of the instructions.   The patient was advised to call back or seek an in-person evaluation if the symptoms worsen or if the condition fails to improve as anticipated.  I provided 30 minutes of non-face-to-face time during this encounter.  The patient was located at home.  The provider was located at home.   Corie Chiquito, PMHNP   Subjective:   Patient ID:  Kyle Scott is a 43 y.o. (DOB [birth year]) male.  Chief Complaint:  Chief Complaint  Patient presents with  . ADD    HPI ANZEL MATHIASON presents to the office today for follow-up of ADD. He reports that Vyvanse 50 mg po q am continues to be effective. HE reports that in in late 2002/early 2003. Reports that he was dx'd in childhood with ADHD. He reports that he sought tx in juniro year and took Adderall at that time and then stopped it. He reports that he noticed ADD s/s when he returned to grad school in 2011 and noticed more ADD s/s at that time. Reports that he has taken up to 70 mg po q am and then decreased dose in 2015 and decreased dose to 50 mg po qd and this has been effective until about 4 pm -5 pm. 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 Reports that before the pandemic he would get up and leave home around 5-5:30 for 1.5 commute. Reports during pandemic he has been working from home and has  been able to sleep later and continues to work long hours. Reports mild distractibility with working from home. Appetite has been stable. He denies difficulty with insomnia. Reports some work related anxiety. Denies physical s/s of anxiety. He reports some worry and trying to problem-solving. He reports increased frustration. Denies significant depression. Reports occ situational sad mood. He reports that he has periods of low energy when mood is lower. Reports energy is "normal." Motivation is "ok for the most part." Enjoys working with people and time with family. Denies SI.   He has been working from home and has 5 children. 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 Has been in current role for 2 years and has had a difficult project for the past year.  He and family are getting ready to move to [facility] in late May/early June.  Reports a few years ago he would notice his heart "pounding" with exercise and has not noticed this recently.   He reports that he has some "exhaustion" on the weekends when he does not take Vyvanse.   Past Psychiatric Medication Trials: Adderall  Review of Systems:  Review of Systems  Cardiovascular: Negative for palpitations.  Musculoskeletal: Negative for gait problem.  Neurological: Negative for tremors.  Psychiatric/Behavioral:       Please refer to HPI    Medications: I  have reviewed the patient's current medications.  Current Outpatient Medications  Medication Sig Dispense Refill  . lisdexamfetamine (VYVANSE) 50 MG capsule Take 1 capsule (50 mg total) by mouth daily. 30 capsule 0  . Multiple Vitamin (MULTIVITAMIN) tablet Take 1 tablet by mouth daily.     No current facility-administered medications for this visit.     Medication Side Effects: None  Allergies: No Known Allergies  Past Medical History:  Diagnosis Date  . ADHD     Family History  Problem Relation Age of Onset  .  Hypothyroidism Mother   . Prostate cancer Father     Social History   Socioeconomic History  . Marital status: Married    Spouse name: Not on file  . Number of children: Not on file  . Years of education: Not on file  . Highest education level: Not on file  Occupational History  . Not on file  Social Needs  . Financial resource strain: Not on file  . Food insecurity:    Worry: Not on file    Inability: Not on file  . Transportation needs:    Medical: Not on file    Non-medical: Not on file  Tobacco Use  . Smoking status: Never Smoker  . Smokeless tobacco: Never Used  Substance and Sexual Activity  . Alcohol use: No  . Drug use: No  . Sexual activity: Not on file  Lifestyle  . Physical activity:    Days per week: Not on file    Minutes per session: Not on file  . Stress: Not on file  Relationships  . Social connections:    Talks on phone: Not on file    Gets together: Not on file    Attends religious service: Not on file    Active member of club or organization: Not on file    Attends meetings of clubs or organizations: Not on file    Relationship status: Not on file  . Intimate partner violence:    Fear of current or ex partner: Not on file    Emotionally abused: Not on file    Physically abused: Not on file    Forced sexual activity: Not on file  Other Topics Concern  . Not on file  Social History Narrative  . Not on file    Past Medical History, Surgical history, Social history, and Family history were reviewed and updated as appropriate.   Please see review of systems for further details on the patient's review from today.   Objective:   Physical Exam:  There were no vitals taken for this visit.  Physical Exam Neurological:     Mental Status: He is alert and oriented to person, place, and time.     Cranial Nerves: No dysarthria.  Psychiatric:        Attention and Perception: Attention normal.        Mood and Affect: Mood normal.        Speech:  Speech normal.        Behavior: Behavior is cooperative.        Thought Content: Thought content normal. Thought content is not paranoid or delusional. Thought content does not include homicidal or suicidal ideation. Thought content does not include homicidal or suicidal plan.        Cognition and Memory: Cognition and memory normal.        Judgment: Judgment normal.     Lab Review:     Component Value Date/Time   NA  138 08/13/2018 0714   K 3.7 08/13/2018 0714   CL 105 08/13/2018 0714   CO2 27 08/13/2018 0714   GLUCOSE 111 (H) 08/13/2018 0714   BUN 17 08/13/2018 0714   CREATININE 1.08 08/13/2018 0714   CALCIUM 8.9 08/13/2018 0714   PROT 7.3 03/15/2015 2212   ALBUMIN 4.4 03/15/2015 2212   AST 42 (H) 03/15/2015 2212   ALT 48 03/15/2015 2212   ALKPHOS 66 03/15/2015 2212   BILITOT 0.9 03/15/2015 2212   GFRNONAA >60 08/13/2018 0714   GFRAA >60 08/13/2018 0714       Component Value Date/Time   WBC 4.9 08/13/2018 0714   RBC 5.33 08/13/2018 0714   HGB 16.0 08/13/2018 0714   HCT 46.5 08/13/2018 0714   PLT 215 08/13/2018 0714   MCV 87.2 08/13/2018 0714   MCH 30.0 08/13/2018 0714   MCHC 34.4 08/13/2018 0714   RDW 12.2 08/13/2018 0714   LYMPHSABS 1.9 02/15/2009 0936   MONOABS 0.5 02/15/2009 0936   EOSABS 0.1 02/15/2009 0936   BASOSABS 0.0 02/15/2009 0936    No results found for: POCLITH, LITHIUM   No results found for: PHENYTOIN, PHENOBARB, VALPROATE, CBMZ   .res Assessment: Plan:   Continue Vyvanse 50 mg p.o. every morning for attention deficit. Patient is currently in the process of moving to the [place] area and is unsure which pharmacy he will be using after his move.  Patient reports that he will call with pharmacy info after he moves.   Patient reports that he plans to establish care in the [facility] area for ongoing management of attention deficit disorder.  Patient to follow-up on an as-needed basis only with this office.  Attention deficit hyperactivity  disorder (ADHD), unspecified ADHD type  Please see After Visit Summary for patient specific instructions.  No future appointments.  No orders of the defined types were placed in this encounter.     -------------------------------

## 2018-12-24 ENCOUNTER — Telehealth: Payer: Self-pay | Admitting: Psychiatry

## 2018-12-24 ENCOUNTER — Other Ambulatory Visit: Payer: Self-pay

## 2018-12-24 MED ORDER — LISDEXAMFETAMINE DIMESYLATE 50 MG PO CAPS: 50.0000 mg | ORAL_CAPSULE | Freq: Every day | ORAL | 0 refills | Status: DC

## 2018-12-24 NOTE — Telephone Encounter (Signed)
Pt wishes to have a new refill of Vyvanse 50mg  sent to [facility] in [place]. I have added to system. [facility] [place]

## 2018-12-24 NOTE — Telephone Encounter (Signed)
Order pended for approval

## 2019-01-24 ENCOUNTER — Other Ambulatory Visit: Payer: Self-pay

## 2019-01-24 ENCOUNTER — Telehealth: Payer: Self-pay | Admitting: Psychiatry

## 2019-01-24 MED ORDER — LISDEXAMFETAMINE DIMESYLATE 50 MG PO CAPS: 50.0000 mg | ORAL_CAPSULE | Freq: Every day | ORAL | 0 refills | Status: DC

## 2019-01-24 NOTE — Telephone Encounter (Signed)
Pended for approval.

## 2019-01-24 NOTE — Telephone Encounter (Signed)
Kyle Scott called to request refill of his Vyvanse.  No appt scheduled but he is to return on as needed basis.  Last appt 11/21/2018.  [facility]

## 2019-02-27 ENCOUNTER — Other Ambulatory Visit: Payer: Self-pay

## 2019-02-27 ENCOUNTER — Telehealth: Payer: Self-pay | Admitting: Psychiatry

## 2019-02-27 MED ORDER — LISDEXAMFETAMINE DIMESYLATE 50 MG PO CAPS: 50.0000 mg | ORAL_CAPSULE | Freq: Every day | ORAL | 0 refills | Status: DC

## 2019-02-27 NOTE — Telephone Encounter (Signed)
Pended for approval, last refill 01/28/2019

## 2019-02-27 NOTE — Telephone Encounter (Signed)
Kyle Scott called to request refill of his vyvanse.  No appt scheduled.  [facility] [place]

## 2019-03-31 ENCOUNTER — Telehealth: Payer: Self-pay | Admitting: Psychiatry

## 2019-03-31 ENCOUNTER — Other Ambulatory Visit: Payer: Self-pay

## 2019-03-31 MED ORDER — LISDEXAMFETAMINE DIMESYLATE 50 MG PO CAPS: 50.0000 mg | ORAL_CAPSULE | Freq: Every day | ORAL | 0 refills | Status: DC

## 2019-03-31 NOTE — Telephone Encounter (Signed)
Last refill 02/27/2019 Last appt 11/2018 Nothing scheduled for follow up Pended for approval

## 2019-03-31 NOTE — Telephone Encounter (Signed)
Patient called and said that he needs a refill of his vyvanse 50mg  [facility] [place] on [address]

## 2019-03-31 NOTE — Telephone Encounter (Signed)
Please contact pt to find out if he has found a new provider after moving or if he plans to continue treatment in our office. Insurance may cover televisit if needed.

## 2019-04-01 NOTE — Telephone Encounter (Signed)
Spoke with pt. He has found a new provider but is on a waiting list to be scheduled. He is going to set up a telehealth with you so he can continue to get his medications until they can see him. He said his insurance does cover this and thank you.

## 2019-05-02 ENCOUNTER — Telehealth: Payer: Self-pay | Admitting: Psychiatry

## 2019-05-02 NOTE — Telephone Encounter (Signed)
Patient called and left a message stating he needed a refill on his vyvanse 50 mg [facility] [place]. He does have an appointment  On Monday 10/26

## 2019-05-05 ENCOUNTER — Ambulatory Visit (INDEPENDENT_AMBULATORY_CARE_PROVIDER_SITE_OTHER): Payer: 59 | Admitting: Psychiatry

## 2019-05-05 ENCOUNTER — Other Ambulatory Visit: Payer: Self-pay

## 2019-05-05 ENCOUNTER — Encounter: Payer: Self-pay | Admitting: Psychiatry

## 2019-05-05 DIAGNOSIS — F4322 Adjustment disorder with anxiety: Secondary | ICD-10-CM

## 2019-05-05 DIAGNOSIS — F909 Attention-deficit hyperactivity disorder, unspecified type: Secondary | ICD-10-CM | POA: Diagnosis not present

## 2019-05-05 MED ORDER — LISDEXAMFETAMINE DIMESYLATE 50 MG PO CAPS: 50.0000 mg | ORAL_CAPSULE | Freq: Every day | ORAL | 0 refills | Status: DC

## 2019-05-05 MED ORDER — BUSPIRONE HCL 15 MG PO TABS: ORAL_TABLET | ORAL | 1 refills | Status: DC

## 2019-05-05 NOTE — Progress Notes (Signed)
Kyle Scott [id number] 12/23/1975 43 y.o.  Virtual Visit via Video Note  I connected with pt @ on 05/05/19 at  2:00 PM EDT by a video enabled telemedicine application and verified that I am speaking with the correct person using two identifiers.   I discussed the limitations of evaluation and management by telemedicine and the availability of in person appointments. The patient expressed understanding and agreed to proceed.  I discussed the assessment and treatment plan with the patient. The patient was provided an opportunity to ask questions and all were answered. The patient agreed with the plan and demonstrated an understanding of the instructions.   The patient was advised to call back or seek an in-person evaluation if the symptoms worsen or if the condition fails to improve as anticipated.  I provided 25 minutes of non-face-to-face time during this encounter.  The patient was located at home.  The provider was located at [place].   Thayer Headings, PMHNP   Subjective:   Patient ID:  Kyle Scott is a 43 y.o. (DOB 1976/01/20) male.  Chief Complaint:  Chief Complaint  Patient presents with  . Anxiety  . Follow-up    ADD    HPI Kyle Scott presents for follow-up of ADD. He relocated in June. He has 5 children that are going to school virtually and he has been trying to help his children and wife manage virtual school. He reports that he has had increased work demands, with 15-hour days and 7 days a week. He is waiting to change positions. He reports that he has had some stress.He reports that he is sleeping about 6 hours a night due to responsibilities. Appetite has been normal. Weight has been been stable. He reports that he has had some occasional low mood in response to stress and anxiety. Energy is somewhat lower and typically is in front of the computer screen most of the day. Motivation is fair. Concentration is adequate. Denies SI.   Notices that he feels  overwhelmed when he does not take Vyvanse.   Review of Systems:  Review of Systems  Cardiovascular: Negative for palpitations.  Musculoskeletal: Negative for gait problem.  Skin: Positive for rash.       Reports that he has had a rash on his chest and around his neck. Initially thought it was poison ivy and has been treated for this poison ivy with minimal improvement. He questions if it could be stress related. He reports that itching is more intense at times and he notices scratch marks. Has been treated with prednisone and topical medication. Started 12 weeks ago.   Neurological: Negative for tremors.       He reports that he has been having a new eye twitch.  Psychiatric/Behavioral:       Please refer to HPI   Denies any change in detergents or soaps. He reports that Benadryl was ineffective.    Medications: I have reviewed the patient's current medications.  Current Outpatient Medications  Medication Sig Dispense Refill  . lisdexamfetamine (VYVANSE) 50 MG capsule Take 1 capsule (50 mg total) by mouth daily. 30 capsule 0  . Multiple Vitamin (MULTIVITAMIN) tablet Take 1 tablet by mouth daily.    . busPIRone (BUSPAR) 15 MG tablet Take 1/3 tablet p.o. twice daily for 1 week, then take 2/3 tablet p.o. twice daily for 1 week, then take 1 tablet p.o. twice daily 60 tablet 1  . [START ON 06/02/2019] lisdexamfetamine (VYVANSE) 50 MG capsule Take 1  capsule (50 mg total) by mouth daily. 30 capsule 0   No current facility-administered medications for this visit.     Medication Side Effects: None  Allergies: No Known Allergies  Past Medical History:  Diagnosis Date  . ADHD     Family History  Problem Relation Age of Onset  . Hypothyroidism Mother   . Prostate cancer Father     Social History   Socioeconomic History  . Marital status: Married    Spouse name: Not on file  . Number of children: Not on file  . Years of education: Not on file  . Highest education level: Not on  file  Occupational History  . Not on file  Social Needs  . Financial resource strain: Not on file  . Food insecurity    Worry: Not on file    Inability: Not on file  . Transportation needs    Medical: Not on file    Non-medical: Not on file  Tobacco Use  . Smoking status: Never Smoker  . Smokeless tobacco: Never Used  Substance and Sexual Activity  . Alcohol use: No  . Drug use: No  . Sexual activity: Not on file  Lifestyle  . Physical activity    Days per week: Not on file    Minutes per session: Not on file  . Stress: Not on file  Relationships  . Social Musicianconnections    Talks on phone: Not on file    Gets together: Not on file    Attends religious service: Not on file    Active member of club or organization: Not on file    Attends meetings of clubs or organizations: Not on file    Relationship status: Not on file  . Intimate partner violence    Fear of current or ex partner: Not on file    Emotionally abused: Not on file    Physically abused: Not on file    Forced sexual activity: Not on file  Other Topics Concern  . Not on file  Social History Narrative  . Not on file    Past Medical History, Surgical history, Social history, and Family history were reviewed and updated as appropriate.   Please see review of systems for further details on the patient's review from today.   Objective:   Physical Exam:  There were no vitals taken for this visit.  Physical Exam Neurological:     Mental Status: He is alert and oriented to person, place, and time.     Cranial Nerves: No dysarthria.  Psychiatric:        Attention and Perception: Attention normal.        Mood and Affect: Mood is anxious.        Speech: Speech normal.        Behavior: Behavior is cooperative.        Thought Content: Thought content normal. Thought content is not paranoid or delusional. Thought content does not include homicidal or suicidal ideation. Thought content does not include homicidal or  suicidal plan.        Cognition and Memory: Cognition and memory normal.        Judgment: Judgment normal.     Comments: Insight intact     Lab Review:     Component Value Date/Time   NA 138 08/13/2018 0714   K 3.7 08/13/2018 0714   CL 105 08/13/2018 0714   CO2 27 08/13/2018 0714   GLUCOSE 111 (H) 08/13/2018 [id number]  BUN 17 08/13/2018 0714   CREATININE 1.08 08/13/2018 0714   CALCIUM 8.9 08/13/2018 0714   PROT 7.3 03/15/2015 2212   ALBUMIN 4.4 03/15/2015 2212   AST 42 (H) 03/15/2015 2212   ALT 48 03/15/2015 2212   ALKPHOS 66 03/15/2015 2212   BILITOT 0.9 03/15/2015 2212   GFRNONAA >60 08/13/2018 0714   GFRAA >60 08/13/2018 0714       Component Value Date/Time   WBC 4.9 08/13/2018 0714   RBC 5.33 08/13/2018 0714   HGB 16.0 08/13/2018 0714   HCT 46.5 08/13/2018 0714   PLT 215 08/13/2018 0714   MCV 87.2 08/13/2018 0714   MCH 30.0 08/13/2018 0714   MCHC 34.4 08/13/2018 0714   RDW 12.2 08/13/2018 0714   LYMPHSABS 1.9 02/15/2009 0936   MONOABS 0.5 02/15/2009 0936   EOSABS 0.1 02/15/2009 0936   BASOSABS 0.0 02/15/2009 0936    No results found for: POCLITH, LITHIUM   No results found for: PHENYTOIN, PHENOBARB, VALPROATE, CBMZ   .res Assessment: Plan:   Discussed potential benefits, risks, and side effects of several treatment options for anxiety and/or stress related hives to include hydroxyzine and BuSpar.  Discussed that hydroxyzine is a prescription and has some antianxiety properties and most likely side effect would be drowsiness.  Patient reports that he prefers to avoid medications with high likelihood of drowsiness.  Patient agrees to trial of BuSpar for situational stress.  Discussed considering decrease or discontinuation of BuSpar in the future once acute psychosocial stressors have improved or resolved. Will continue Vyvanse 50 mg every morning for attention deficit disorder. Patient to follow-up in 6 weeks or sooner if clinically indicated. Patient advised to  contact office with any questions, adverse effects, or acute worsening in signs and symptoms.  Kyle Scott was seen today for anxiety and follow-up.  Diagnoses and all orders for this visit:  Attention deficit hyperactivity disorder (ADHD), unspecified ADHD type -     lisdexamfetamine (VYVANSE) 50 MG capsule; Take 1 capsule (50 mg total) by mouth daily. -     lisdexamfetamine (VYVANSE) 50 MG capsule; Take 1 capsule (50 mg total) by mouth daily.  Adjustment disorder with anxious mood -     busPIRone (BUSPAR) 15 MG tablet; Take 1/3 tablet p.o. twice daily for 1 week, then take 2/3 tablet p.o. twice daily for 1 week, then take 1 tablet p.o. twice daily     Please see After Visit Summary for patient specific instructions.  Future Appointments  Date Time Provider Department Center  06/16/2019  1:30 PM Corie Chiquito, PMHNP [facility] None    No orders of the defined types were placed in this encounter.     -------------------------------

## 2019-06-16 ENCOUNTER — Encounter: Payer: Self-pay | Admitting: Psychiatry

## 2019-06-16 ENCOUNTER — Ambulatory Visit (INDEPENDENT_AMBULATORY_CARE_PROVIDER_SITE_OTHER): Payer: 59 | Admitting: Psychiatry

## 2019-06-16 DIAGNOSIS — F909 Attention-deficit hyperactivity disorder, unspecified type: Secondary | ICD-10-CM

## 2019-06-16 MED ORDER — LISDEXAMFETAMINE DIMESYLATE 50 MG PO CAPS: 50.0000 mg | ORAL_CAPSULE | Freq: Every day | ORAL | 0 refills | Status: DC

## 2019-06-16 NOTE — Progress Notes (Signed)
Kyle Scott [id number] 07-18-1975 43 y.o.  Virtual Visit via Video Note  I connected with pt @ on 06/16/19 at  1:30 PM EST by a video enabled telemedicine application and verified that I am speaking with the correct person using two identifiers.   I discussed the limitations of evaluation and management by telemedicine and the availability of in person appointments. The patient expressed understanding and agreed to proceed.  I discussed the assessment and treatment plan with the patient. The patient was provided an opportunity to ask questions and all were answered. The patient agreed with the plan and demonstrated an understanding of the instructions.   The patient was advised to call back or seek an in-person evaluation if the symptoms worsen or if the condition fails to improve as anticipated.  I provided 25 minutes of non-face-to-face time during this encounter.  The patient was located at home.  The provider was located at [place].   Thayer Headings, PMHNP   Subjective:   Patient ID:  Kyle Scott is a 43 y.o. (DOB 1976/06/21) male.  Chief Complaint:  Chief Complaint  Patient presents with  . Follow-up    ADD, situational stress/anxiety    HPI Kyle Scott presents for follow-up of attention deficit and situational anxiety. He reports that he stopped taking Buspar because it was causing some dizziness about an hour after taking it. He reports that he also felt light-headed and would need to lie down at times. He reports that the side effects were most significant with full tab and had minimal side effects with 5 mg BID. He reports that anxiety is at a "more manageable level." He reports that his mood is "the same." Less moments of frustration since work stress has decreased. He reports that his sleep and appetite have been "normal." He reports that his motivation is ok. Energy is about the same. Concentration has been about the same. Denies SI.   He reports that he  will be transitioning to a new position in February. He reports that he is working slightly less hours than he was before. Reports that he has also been setting some limits at work when he is off.   Past Psychiatric Medication Trials: Buspar- Dizziness Vyvanse  Review of Systems:  Review of Systems  Cardiovascular: Negative for palpitations.  Musculoskeletal: Negative for gait problem.       Will have wrist surgery next.   Neurological: Negative for tremors.  Psychiatric/Behavioral:       Please refer to HPI    Medications: I have reviewed the patient's current medications.  Current Outpatient Medications  Medication Sig Dispense Refill  . [START ON 09/01/2019] lisdexamfetamine (VYVANSE) 50 MG capsule Take 1 capsule (50 mg total) by mouth daily. 30 capsule 0  . [START ON 08/04/2019] lisdexamfetamine (VYVANSE) 50 MG capsule Take 1 capsule (50 mg total) by mouth daily. 30 capsule 0  . Multiple Vitamin (MULTIVITAMIN) tablet Take 1 tablet by mouth daily.    Derrill Memo ON 07/07/2019] lisdexamfetamine (VYVANSE) 50 MG capsule Take 1 capsule (50 mg total) by mouth daily. 30 capsule 0   No current facility-administered medications for this visit.     Medication Side Effects: None  Allergies: No Known Allergies  Past Medical History:  Diagnosis Date  . ADHD     Family History  Problem Relation Age of Onset  . Hypothyroidism Mother   . Prostate cancer Father     Social History   Socioeconomic History  . Marital  status: Married    Spouse name: Not on file  . Number of children: Not on file  . Years of education: Not on file  . Highest education level: Not on file  Occupational History  . Not on file  Social Needs  . Financial resource strain: Not on file  . Food insecurity    Worry: Not on file    Inability: Not on file  . Transportation needs    Medical: Not on file    Non-medical: Not on file  Tobacco Use  . Smoking status: Never Smoker  . Smokeless tobacco: Never  Used  Substance and Sexual Activity  . Alcohol use: No  . Drug use: No  . Sexual activity: Not on file  Lifestyle  . Physical activity    Days per week: Not on file    Minutes per session: Not on file  . Stress: Not on file  Relationships  . Social Musician on phone: Not on file    Gets together: Not on file    Attends religious service: Not on file    Active member of club or organization: Not on file    Attends meetings of clubs or organizations: Not on file    Relationship status: Not on file  . Intimate partner violence    Fear of current or ex partner: Not on file    Emotionally abused: Not on file    Physically abused: Not on file    Forced sexual activity: Not on file  Other Topics Concern  . Not on file  Social History Narrative  . Not on file    Past Medical History, Surgical history, Social history, and Family history were reviewed and updated as appropriate.   Please see review of systems for further details on the patient's review from today.   Objective:   Physical Exam:  Pulse 72   Wt 190 lb (86.2 kg)   BMI 28.06 kg/m   Physical Exam Neurological:     Mental Status: He is alert and oriented to person, place, and time.     Cranial Nerves: No dysarthria.  Psychiatric:        Attention and Perception: Attention normal.        Mood and Affect: Mood is not depressed.        Speech: Speech normal.        Behavior: Behavior is cooperative.        Thought Content: Thought content normal. Thought content is not paranoid or delusional. Thought content does not include homicidal or suicidal ideation. Thought content does not include homicidal or suicidal plan.        Cognition and Memory: Cognition and memory normal.        Judgment: Judgment normal.     Comments: Mood presents as less anxious compared to previous exam. Insight intact.     Lab Review:     Component Value Date/Time   NA 138 08/13/2018 0714   K 3.7 08/13/2018 0714   CL 105  08/13/2018 0714   CO2 27 08/13/2018 0714   GLUCOSE 111 (H) 08/13/2018 0714   BUN 17 08/13/2018 0714   CREATININE 1.08 08/13/2018 0714   CALCIUM 8.9 08/13/2018 0714   PROT 7.3 03/15/2015 2212   ALBUMIN 4.4 03/15/2015 2212   AST 42 (H) 03/15/2015 2212   ALT 48 03/15/2015 2212   ALKPHOS 66 03/15/2015 2212   BILITOT 0.9 03/15/2015 2212   GFRNONAA >60 08/13/2018 1884  GFRAA >60 08/13/2018 0714       Component Value Date/Time   WBC 4.9 08/13/2018 0714   RBC 5.33 08/13/2018 0714   HGB 16.0 08/13/2018 0714   HCT 46.5 08/13/2018 0714   PLT 215 08/13/2018 0714   MCV 87.2 08/13/2018 0714   MCH 30.0 08/13/2018 0714   MCHC 34.4 08/13/2018 0714   RDW 12.2 08/13/2018 0714   LYMPHSABS 1.9 02/15/2009 0936   MONOABS 0.5 02/15/2009 0936   EOSABS 0.1 02/15/2009 0936   BASOSABS 0.0 02/15/2009 0936    No results found for: POCLITH, LITHIUM   No results found for: PHENYTOIN, PHENOBARB, VALPROATE, CBMZ   .res Assessment: Plan:   Patient seen for 30 minutes and greater than 50% of visit spent counseling patient regarding response to BuSpar and situational anxiety.  Discussed that patient may have had response to lower doses of BuSpar and that this could be a consideration in the future if anxiety worsens.  Patient reports that he has been less anxious due to some improvement in acute psychosocial stressors and anticipating positive changes at work in the next couple of months.  Patient reports that anxiety is manageable at this time and agrees to contact office if anxiety worsens. Will continue Vyvanse 50-minute once daily for attention deficit. Patient to follow-up in 3 months or sooner if clinically indicated. Patient advised to contact office with any questions, adverse effects, or acute worsening in signs and symptoms.  Jilda PandaJared was seen today for follow-up.  Diagnoses and all orders for this visit:  Attention deficit hyperactivity disorder (ADHD), unspecified ADHD type -      lisdexamfetamine (VYVANSE) 50 MG capsule; Take 1 capsule (50 mg total) by mouth daily. -     lisdexamfetamine (VYVANSE) 50 MG capsule; Take 1 capsule (50 mg total) by mouth daily. -     lisdexamfetamine (VYVANSE) 50 MG capsule; Take 1 capsule (50 mg total) by mouth daily.     Please see After Visit Summary for patient specific instructions.  No future appointments.  No orders of the defined types were placed in this encounter.     -------------------------------

## 2019-06-26 ENCOUNTER — Other Ambulatory Visit: Payer: Self-pay | Admitting: Psychiatry

## 2019-06-26 DIAGNOSIS — F4322 Adjustment disorder with anxiety: Secondary | ICD-10-CM

## 2019-10-06 ENCOUNTER — Encounter: Payer: Self-pay | Admitting: Psychiatry

## 2019-10-06 ENCOUNTER — Ambulatory Visit (INDEPENDENT_AMBULATORY_CARE_PROVIDER_SITE_OTHER): Payer: 59 | Admitting: Psychiatry

## 2019-10-06 DIAGNOSIS — F909 Attention-deficit hyperactivity disorder, unspecified type: Secondary | ICD-10-CM | POA: Diagnosis not present

## 2019-10-06 MED ORDER — LISDEXAMFETAMINE DIMESYLATE 50 MG PO CAPS: 50.0000 mg | ORAL_CAPSULE | Freq: Every day | ORAL | 0 refills | Status: DC

## 2019-10-06 NOTE — Progress Notes (Signed)
Kyle Scott [id number] 19-Mar-1976 44 y.o.  Virtual Visit via Telephone Note  I connected with pt on 10/06/19 at  8:00 AM EDT by telephone and verified that I am speaking with the correct person using two identifiers.   I discussed the limitations, risks, security and privacy concerns of performing an evaluation and management service by telephone and the availability of in person appointments. I also discussed with the patient that there may be a patient responsible charge related to this service. The patient expressed understanding and agreed to proceed.   I discussed the assessment and treatment plan with the patient. The patient was provided an opportunity to ask questions and all were answered. The patient agreed with the plan and demonstrated an understanding of the instructions.   The patient was advised to call back or seek an in-person evaluation if the symptoms worsen or if the condition fails to improve as anticipated.  I provided 25 minutes of non-face-to-face time during this encounter.  The patient was located at home.  The provider was located at [place].   Thayer Headings, PMHNP   Subjective:   Patient ID:  Kyle Scott is a 44 y.o. (DOB [birth year]) male.  Chief Complaint:  Chief Complaint  Patient presents with  . Follow-up    ADD    HPI Kyle Scott presents for follow-up of attention. Had a promotion in late February and was able to take 2 weeks vacation. Now working in [employer]. He anticipates being able to plan more in this position. "The amount of stress has dropped significantly." Denies any significant anxiety. He reports that his mood has been "good" and mood has improved. Sleeping well. Energy and motivation have improved. Concentration has been adequate. Enjoyed vacation. Denies SI.   Schedule has normalized and no longer working as many overtime hours.   Past Psychiatric Medication Trials: Buspar- Dizziness Vyvanse  Review of  Systems:  Review of Systems  Cardiovascular: Negative for palpitations.  Musculoskeletal: Negative for gait problem.  Neurological: Negative for tremors.  Psychiatric/Behavioral:       Please refer to HPI    Medications: I have reviewed the patient's current medications.  Current Outpatient Medications  Medication Sig Dispense Refill  . [START ON 12/01/2019] lisdexamfetamine (VYVANSE) 50 MG capsule Take 1 capsule (50 mg total) by mouth daily. 30 capsule 0  . [START ON 11/03/2019] lisdexamfetamine (VYVANSE) 50 MG capsule Take 1 capsule (50 mg total) by mouth daily. 30 capsule 0  . lisdexamfetamine (VYVANSE) 50 MG capsule Take 1 capsule (50 mg total) by mouth daily. 30 capsule 0  . Multiple Vitamin (MULTIVITAMIN) tablet Take 1 tablet by mouth daily.     No current facility-administered medications for this visit.    Medication Side Effects: None  Allergies: No Known Allergies  Past Medical History:  Diagnosis Date  . ADHD     Family History  Problem Relation Age of Onset  . Hypothyroidism Mother   . Prostate cancer Father     Social History   Socioeconomic History  . Marital status: Married    Spouse name: Not on file  . Number of children: Not on file  . Years of education: Not on file  . Highest education level: Not on file  Occupational History  . Not on file  Tobacco Use  . Smoking status: Never Smoker  . Smokeless tobacco: Never Used  Substance and Sexual Activity  . Alcohol use: No  . Drug use: No  . Sexual  activity: Not on file  Other Topics Concern  . Not on file  Social History Narrative  . Not on file   Social Determinants of Health   Financial Resource Strain:   . Difficulty of Paying Living Expenses:   Food Insecurity:   . Worried About Programme researcher, broadcasting/film/video in the Last Year:   . Barista in the Last Year:   Transportation Needs:   . Freight forwarder (Medical):   Marland Kitchen Lack of Transportation (Non-Medical):   Physical Activity:   .  Days of Exercise per Week:   . Minutes of Exercise per Session:   Stress:   . Feeling of Stress :   Social Connections:   . Frequency of Communication with Friends and Family:   . Frequency of Social Gatherings with Friends and Family:   . Attends Religious Services:   . Active Member of Clubs or Organizations:   . Attends Banker Meetings:   Marland Kitchen Marital Status:   Intimate Partner Violence:   . Fear of Current or Ex-Partner:   . Emotionally Abused:   Marland Kitchen Physically Abused:   . Sexually Abused:     Past Medical History, Surgical history, Social history, and Family history were reviewed and updated as appropriate.   Please see review of systems for further details on the patient's review from today.   Objective:   Physical Exam:  Wt 192 lb (87.1 kg)   BMI 28.35 kg/m   Physical Exam Neurological:     Mental Status: He is alert and oriented to person, place, and time.     Cranial Nerves: No dysarthria.  Psychiatric:        Attention and Perception: Attention and perception normal.        Mood and Affect: Mood normal.        Speech: Speech normal.        Behavior: Behavior is cooperative.        Thought Content: Thought content normal. Thought content is not paranoid or delusional. Thought content does not include homicidal or suicidal ideation. Thought content does not include homicidal or suicidal plan.        Cognition and Memory: Cognition and memory normal.        Judgment: Judgment normal.     Comments: Insight intact     Lab Review:     Component Value Date/Time   NA 138 08/13/2018 0714   K 3.7 08/13/2018 0714   CL 105 08/13/2018 0714   CO2 27 08/13/2018 0714   GLUCOSE 111 (H) 08/13/2018 0714   BUN 17 08/13/2018 0714   CREATININE 1.08 08/13/2018 0714   CALCIUM 8.9 08/13/2018 0714   PROT 7.3 03/15/2015 2212   ALBUMIN 4.4 03/15/2015 2212   AST 42 (H) 03/15/2015 2212   ALT 48 03/15/2015 2212   ALKPHOS 66 03/15/2015 2212   BILITOT 0.9 03/15/2015  2212   GFRNONAA >60 08/13/2018 0714   GFRAA >60 08/13/2018 0714       Component Value Date/Time   WBC 4.9 08/13/2018 0714   RBC 5.33 08/13/2018 0714   HGB 16.0 08/13/2018 0714   HCT 46.5 08/13/2018 0714   PLT 215 08/13/2018 0714   MCV 87.2 08/13/2018 0714   MCH 30.0 08/13/2018 0714   MCHC 34.4 08/13/2018 0714   RDW 12.2 08/13/2018 0714   LYMPHSABS 1.9 02/15/2009 0936   MONOABS 0.5 02/15/2009 0936   EOSABS 0.1 02/15/2009 0936   BASOSABS 0.0 02/15/2009 0936  No results found for: POCLITH, LITHIUM   No results found for: PHENYTOIN, PHENOBARB, VALPROATE, CBMZ   .res Assessment: Plan:   Will continue current plan of care since target signs and symptoms are well controlled without any tolerability issues. Continue Vyvanse 50 mg po q am for ADHD.  Recommend f/u in 3 months or sooner if clinically indicated. Patient advised to contact office with any questions, adverse effects, or acute worsening in signs and symptoms.  Hurshell was seen today for follow-up.  Diagnoses and all orders for this visit:  Attention deficit hyperactivity disorder (ADHD), unspecified ADHD type -     lisdexamfetamine (VYVANSE) 50 MG capsule; Take 1 capsule (50 mg total) by mouth daily. -     lisdexamfetamine (VYVANSE) 50 MG capsule; Take 1 capsule (50 mg total) by mouth daily. -     lisdexamfetamine (VYVANSE) 50 MG capsule; Take 1 capsule (50 mg total) by mouth daily.    Please see After Visit Summary for patient specific instructions.  No future appointments.  No orders of the defined types were placed in this encounter.     -------------------------------

## 2020-01-06 ENCOUNTER — Telehealth: Payer: Self-pay | Admitting: Psychiatry

## 2020-01-06 NOTE — Telephone Encounter (Signed)
Pt made f/up appt July 20. Will need a RF of Vyvanse sent to [facility] in [place], on file. Thanks.

## 2020-01-06 NOTE — Telephone Encounter (Signed)
Contacted pharmacist and patient already had Rx on file, they will get it ready for him

## 2020-01-27 ENCOUNTER — Telehealth: Payer: Self-pay | Admitting: Psychiatry

## 2020-01-27 ENCOUNTER — Encounter: Payer: Self-pay | Admitting: Psychiatry

## 2020-01-27 ENCOUNTER — Telehealth (INDEPENDENT_AMBULATORY_CARE_PROVIDER_SITE_OTHER): Payer: No Typology Code available for payment source | Admitting: Psychiatry

## 2020-01-27 VITALS — HR 77

## 2020-01-27 DIAGNOSIS — F909 Attention-deficit hyperactivity disorder, unspecified type: Secondary | ICD-10-CM

## 2020-01-27 MED ORDER — MYDAYIS 37.5 MG PO CP24: 37.5000 mg | ORAL_CAPSULE | Freq: Every morning | ORAL | 0 refills | Status: DC

## 2020-01-27 NOTE — Telephone Encounter (Signed)
Kyle Scott, Kyle Scott are scheduled for a virtual visit with your provider today.    Just as we do with appointments in the office, we must obtain your consent to participate.  Your consent will be active for this visit and any virtual visit you may have with one of our providers in the next 365 days.    If you have a MyChart account, I can also send a copy of this consent to you electronically.  All virtual visits are billed to your insurance company just like a traditional visit in the office.  As this is a virtual visit, video technology does not allow for your provider to perform a traditional examination.  This may limit your provider's ability to fully assess your condition.  If your provider identifies any concerns that need to be evaluated in person or the need to arrange testing such as labs, EKG, etc, we will make arrangements to do so.    Although advances in technology are sophisticated, we cannot ensure that it will always work on either your end or our end.  If the connection with a video visit is poor, we may have to switch to a telephone visit.  With either a video or telephone visit, we are not always able to ensure that we have a secure connection.   I need to obtain your verbal consent now.   Are you willing to proceed with your visit today?   Kyle Scott has provided verbal consent on 01/27/2020 for a virtual visit (video or telephone).   Corie Chiquito, PMHNP 01/27/2020  12:36 PM

## 2020-01-27 NOTE — Progress Notes (Signed)
BARNABY RIPPEON [id number] 1975/11/14 44 y.o.  Virtual Visit via Video Note  I connected with pt @ on 01/27/20 at 12:30 PM EDT by a video enabled telemedicine application and verified that I am speaking with the correct person using two identifiers.   I discussed the limitations of evaluation and management by telemedicine and the availability of in person appointments. The patient expressed understanding and agreed to proceed.  I discussed the assessment and treatment plan with the patient. The patient was provided an opportunity to ask questions and all were answered. The patient agreed with the plan and demonstrated an understanding of the instructions.   The patient was advised to call back or seek an in-person evaluation if the symptoms worsen or if the condition fails to improve as anticipated.  I provided 30 minutes of non-face-to-face time during this encounter.  The patient was located at home.  The provider was located at [facility].   Corie Chiquito, PMHNP   Subjective:   Patient ID:  Kyle Scott is a 44 y.o. (DOB 04-Oct-1975) male.  Chief Complaint:  Chief Complaint  Patient presents with  . ADD    HPI Kyle Scott presents for follow-up of ADD. 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 This has resulted in needing to travel to meet with different offices and employees. He reports that concentration does not seem to be at the same level that it was previously. He has been taking copious notes. Feels "less focused, less attentive." Notices some distractibility. Some challenges with prioritizing different tasks and projects. He reports that his work day is longer now that he needs to interface with groups in different time zones. He reports that several months ago he would periodically feel that he did not notice medication being effective and this has become more frequent. He feels that Vyvanse is  typically effective for about 4-5 hours. He reports that his work day is 9-10 hours duration, typically 7 am to 5-6 pm.   He reports that he sleeps well at night. Feels that he needs to take a 15 minute power nap around 3 pm. He reports that his mood has been stable. Denies anxiety other than  Concern that he needs to be more on top of things. Appetite has been the same. Energy and motivation have been ok. Denies SI.   Past Psychiatric Medication Trials: Buspar- Dizziness Vyvanse- Was on 70 mg in the past when he was working and going to school for [employer] degree when he needed longer duration.  Adderall IR- Took over 10 years ago. May noticed a crash when it wore off. May have had headaches. Concerta-Ineffective   Review of Systems:  Review of Systems  Cardiovascular: Negative for palpitations.  Musculoskeletal: Negative for gait problem.  Neurological: Negative for tremors.  Psychiatric/Behavioral:       Please refer to HPI    Medications: I have reviewed the patient's current medications.  Current Outpatient Medications  Medication Sig Dispense Refill  . lisdexamfetamine (VYVANSE) 50 MG capsule Take 1 capsule (50 mg total) by mouth daily. 30 capsule 0  . lisdexamfetamine (VYVANSE) 50 MG capsule Take 1 capsule (50 mg total) by mouth daily. 30 capsule 0  . lisdexamfetamine (VYVANSE) 50 MG capsule Take 1 capsule (50 mg total) by mouth daily. 30 capsule 0  . Multiple Vitamin (MULTIVITAMIN) tablet Take 1 tablet by mouth daily.    . Amphet-Dextroamphet 3-Bead  ER (MYDAYIS) 37.5 MG CP24 Take 37.5 mg by mouth in the morning. 30 capsule 0   No current facility-administered medications for this visit.    Medication Side Effects: None  Allergies: No Known Allergies  Past Medical History:  Diagnosis Date  . ADHD     Family History  Problem Relation Age of Onset  . Hypothyroidism Mother   . Prostate cancer Father     Social History   Socioeconomic History  . Marital status:  Married    Spouse name: Not on file  . Number of children: Not on file  . Years of education: Not on file  . Highest education level: Not on file  Occupational History  . Not on file  Tobacco Use  . Smoking status: Never Smoker  . Smokeless tobacco: Never Used  Substance and Sexual Activity  . Alcohol use: No  . Drug use: No  . Sexual activity: Not on file  Other Topics Concern  . Not on file  Social History Narrative  . Not on file   Social Determinants of Health   Financial Resource Strain:   . Difficulty of Paying Living Expenses:   Food Insecurity:   . Worried About Programme researcher, broadcasting/film/video in the Last Year:   . Barista in the Last Year:   Transportation Needs:   . Freight forwarder (Medical):   Marland Kitchen Lack of Transportation (Non-Medical):   Physical Activity:   . Days of Exercise per Week:   . Minutes of Exercise per Session:   Stress:   . Feeling of Stress :   Social Connections:   . Frequency of Communication with Friends and Family:   . Frequency of Social Gatherings with Friends and Family:   . Attends Religious Services:   . Active Member of Clubs or Organizations:   . Attends Banker Meetings:   Marland Kitchen Marital Status:   Intimate Partner Violence:   . Fear of Current or Ex-Partner:   . Emotionally Abused:   Marland Kitchen Physically Abused:   . Sexually Abused:     Past Medical History, Surgical history, Social history, and Family history were reviewed and updated as appropriate.   Please see review of systems for further details on the patient's review from today.   Objective:   Physical Exam:  Pulse 77   Physical Exam Neurological:     Mental Status: He is alert and oriented to person, place, and time.     Cranial Nerves: No dysarthria.  Psychiatric:        Attention and Perception: Attention and perception normal.        Mood and Affect: Mood normal.        Speech: Speech normal.        Behavior: Behavior is cooperative.        Thought  Content: Thought content normal. Thought content is not paranoid or delusional. Thought content does not include homicidal or suicidal ideation. Thought content does not include homicidal or suicidal plan.        Cognition and Memory: Cognition and memory normal.        Judgment: Judgment normal.     Comments: Insight intact     Lab Review:     Component Value Date/Time   NA 138 08/13/2018 0714   K 3.7 08/13/2018 0714   CL 105 08/13/2018 0714   CO2 27 08/13/2018 0714   GLUCOSE 111 (H) 08/13/2018 0714   BUN 17 08/13/2018 0714  CREATININE 1.08 08/13/2018 0714   CALCIUM 8.9 08/13/2018 0714   PROT 7.3 03/15/2015 2212   ALBUMIN 4.4 03/15/2015 2212   AST 42 (H) 03/15/2015 2212   ALT 48 03/15/2015 2212   ALKPHOS 66 03/15/2015 2212   BILITOT 0.9 03/15/2015 2212   GFRNONAA >60 08/13/2018 0714   GFRAA >60 08/13/2018 0714       Component Value Date/Time   WBC 4.9 08/13/2018 0714   RBC 5.33 08/13/2018 0714   HGB 16.0 08/13/2018 0714   HCT 46.5 08/13/2018 0714   PLT 215 08/13/2018 0714   MCV 87.2 08/13/2018 0714   MCH 30.0 08/13/2018 0714   MCHC 34.4 08/13/2018 0714   RDW 12.2 08/13/2018 0714   LYMPHSABS 1.9 02/15/2009 0936   MONOABS 0.5 02/15/2009 0936   EOSABS 0.1 02/15/2009 0936   BASOSABS 0.0 02/15/2009 0936    No results found for: POCLITH, LITHIUM   No results found for: PHENYTOIN, PHENOBARB, VALPROATE, CBMZ   .res Assessment: Plan:   Pt seen for 30 minutes and time spent counseling pt re: tx options to possibly improve ADD s/s since he reports that Vyvnase 50 mg po qd is no longer as effective and duration is typically only 4-5 hours when his work day is 9-10 hours. Discussed option to either increase Vyvanse and add immediate release Adderall or switching to Mydayis. Discussed potential benefits, risks, and side effects of Mydayis. Pt agrees to trial of Mydayis. Case staffed with Dr. Jennelle Human. Will start Mydayis 37.5 mg po q am for ADD. May consider increase in Mydayis to  50 mg po q am if 37.5 mg dose is not fully effective.  Pt to f/u in 4 weeks or sooner if clinically indicated.  Patient advised to contact office with any questions, adverse effects, or acute worsening in signs and symptoms.  Dsean was seen today for add.  Diagnoses and all orders for this visit:  Attention deficit hyperactivity disorder (ADHD), unspecified ADHD type -     Amphet-Dextroamphet 3-Bead ER (MYDAYIS) 37.5 MG CP24; Take 37.5 mg by mouth in the morning.     Please see After Visit Summary for patient specific instructions.  No future appointments.  No orders of the defined types were placed in this encounter.     -------------------------------

## 2020-02-16 ENCOUNTER — Telehealth: Payer: Self-pay | Admitting: Psychiatry

## 2020-02-16 ENCOUNTER — Other Ambulatory Visit: Payer: Self-pay

## 2020-02-16 DIAGNOSIS — F909 Attention-deficit hyperactivity disorder, unspecified type: Secondary | ICD-10-CM

## 2020-02-16 NOTE — Telephone Encounter (Signed)
Pt called and said that [facility] [address] [facility] [place]. This pharmacy has the mediciation and cancel the other one since it is out of stock

## 2020-02-16 NOTE — Telephone Encounter (Signed)
Change in pharmacy due to availability. Updated and pended for Shanda Bumps to submit. Patient has never filled medication.

## 2020-02-17 MED ORDER — MYDAYIS 37.5 MG PO CP24: 37.5000 mg | ORAL_CAPSULE | Freq: Every morning | ORAL | 0 refills | Status: DC

## 2020-04-05 ENCOUNTER — Encounter: Payer: Self-pay | Admitting: Psychiatry

## 2020-04-05 ENCOUNTER — Telehealth (INDEPENDENT_AMBULATORY_CARE_PROVIDER_SITE_OTHER): Payer: No Typology Code available for payment source | Admitting: Psychiatry

## 2020-04-05 VITALS — BP 128/84 | HR 94

## 2020-04-05 DIAGNOSIS — F909 Attention-deficit hyperactivity disorder, unspecified type: Secondary | ICD-10-CM

## 2020-04-05 NOTE — Progress Notes (Signed)
Kyle Scott [id number] 07-23-1975 44 y.o.  Virtual Visit via Video Note  I connected with pt @ on 04/05/20 at  2:30 PM EDT by a video enabled telemedicine application and verified that I am speaking with the correct person using two identifiers.   I discussed the limitations of evaluation and management by telemedicine and the availability of in person appointments. The patient expressed understanding and agreed to proceed.  I discussed the assessment and treatment plan with the patient. The patient was provided an opportunity to ask questions and all were answered. The patient agreed with the plan and demonstrated an understanding of the instructions.   The patient was advised to call back or seek an in-person evaluation if the symptoms worsen or if the condition fails to improve as anticipated.  I provided 25 minutes of non-face-to-face time during this encounter.  The patient was located at home.  The provider was located at [facility].   Corie Chiquito, PMHNP   Subjective:   Patient ID:  Kyle Scott is a 44 y.o. (DOB 11-20-1975) male.  Chief Complaint:  Chief Complaint  Patient presents with   ADD    HPI Kyle Scott presents for follow-up of ADD. He reports that it took a few weeks before he was able to start Mydayis. He reports that he getting a longer duration with Mydayis, which is helpful for early morning and late night meetings. Takes medication regularly around 7 am. He reports that he is continuing to adjust to new schedule. He reports that Mydayis seems to be as effective as Vyvanse. He reports that he is able to complete tasks and meet deadlines for work. He notices occ distractibility and this may be due to diminished sleep with altered work schedule. He denies difficulty falling or staying asleep. Denies any anxiety or irritability. Denies any change in appetite. He reports that on weekends he notices some possible stimulant withdrawal with some irritability.  Energy and motivation have been ok with adequate sleep. Denies SI.  Went on vacation to [place] and enjoying this.   Past Psychiatric Medication Trials: Buspar- Dizziness Vyvanse- Was on 70 mg in the past when he was working and going to school for [employer] degree when he needed longer duration.  Mydayis Adderall IR- Took over 10 years ago. May noticed a crash when it wore off. May have had headaches. Concerta-Ineffective  Review of Systems:  Review of Systems  Cardiovascular: Negative for palpitations.  Musculoskeletal: Negative for gait problem.  Neurological: Negative for tremors and headaches.  Psychiatric/Behavioral:       Please refer to HPI    Medications: I have reviewed the patient's current medications.  Current Outpatient Medications  Medication Sig Dispense Refill   Amphet-Dextroamphet 3-Bead ER (MYDAYIS) 37.5 MG CP24 Take 37.5 mg by mouth in the morning. 30 capsule 0   Multiple Vitamin (MULTIVITAMIN) tablet Take 1 tablet by mouth daily.     No current facility-administered medications for this visit.    Medication Side Effects: None  Allergies: No Known Allergies  Past Medical History:  Diagnosis Date   ADHD     Family History  Problem Relation Age of Onset   Hypothyroidism Mother    Prostate cancer Father     Social History   Socioeconomic History   Marital status: Married    Spouse name: Not on file   Number of children: Not on file   Years of education: Not on file   Highest education level: Not on  file  Occupational History   Not on file  Tobacco Use   Smoking status: Never Smoker   Smokeless tobacco: Never Used  Substance and Sexual Activity   Alcohol use: No   Drug use: No   Sexual activity: Not on file  Other Topics Concern   Not on file  Social History Narrative   Not on file   Social Determinants of Health   Financial Resource Strain:    Difficulty of Paying Living Expenses: Not on file  Food  Insecurity:    Worried About Running Out of Food in the Last Year: Not on file   Ran Out of Food in the Last Year: Not on file  Transportation Needs:    Lack of Transportation (Medical): Not on file   Lack of Transportation (Non-Medical): Not on file  Physical Activity:    Days of Exercise per Week: Not on file   Minutes of Exercise per Session: Not on file  Stress:    Feeling of Stress : Not on file  Social Connections:    Frequency of Communication with Friends and Family: Not on file   Frequency of Social Gatherings with Friends and Family: Not on file   Attends Religious Services: Not on file   Active Member of Clubs or Organizations: Not on file   Attends Banker Meetings: Not on file   Marital Status: Not on file  Intimate Partner Violence:    Fear of Current or Ex-Partner: Not on file   Emotionally Abused: Not on file   Physically Abused: Not on file   Sexually Abused: Not on file    Past Medical History, Surgical history, Social history, and Family history were reviewed and updated as appropriate.   Please see review of systems for further details on the patient's review from today.   Objective:   Physical Exam:  BP 128/84    Pulse 94   Physical Exam Constitutional:      General: He is not in acute distress. Musculoskeletal:        General: No deformity.  Neurological:     Mental Status: He is alert and oriented to person, place, and time.     Coordination: Coordination normal.  Psychiatric:        Attention and Perception: Attention and perception normal. He does not perceive auditory or visual hallucinations.        Mood and Affect: Mood normal. Mood is not anxious or depressed. Affect is not labile, blunt, angry or inappropriate.        Speech: Speech normal.        Behavior: Behavior normal.        Thought Content: Thought content normal. Thought content is not paranoid or delusional. Thought content does not include homicidal  or suicidal ideation. Thought content does not include homicidal or suicidal plan.        Cognition and Memory: Cognition and memory normal.        Judgment: Judgment normal.     Comments: Insight intact     Lab Review:     Component Value Date/Time   NA 138 08/13/2018 0714   K 3.7 08/13/2018 0714   CL 105 08/13/2018 0714   CO2 27 08/13/2018 0714   GLUCOSE 111 (H) 08/13/2018 0714   BUN 17 08/13/2018 0714   CREATININE 1.08 08/13/2018 0714   CALCIUM 8.9 08/13/2018 0714   PROT 7.3 03/15/2015 2212   ALBUMIN 4.4 03/15/2015 2212   AST 42 (H) 03/15/2015  2212   ALT 48 03/15/2015 2212   ALKPHOS 66 03/15/2015 2212   BILITOT 0.9 03/15/2015 2212   GFRNONAA >60 08/13/2018 0714   GFRAA >60 08/13/2018 0714       Component Value Date/Time   WBC 4.9 08/13/2018 0714   RBC 5.33 08/13/2018 0714   HGB 16.0 08/13/2018 0714   HCT 46.5 08/13/2018 0714   PLT 215 08/13/2018 0714   MCV 87.2 08/13/2018 0714   MCH 30.0 08/13/2018 0714   MCHC 34.4 08/13/2018 0714   RDW 12.2 08/13/2018 0714   LYMPHSABS 1.9 02/15/2009 0936   MONOABS 0.5 02/15/2009 0936   EOSABS 0.1 02/15/2009 0936   BASOSABS 0.0 02/15/2009 0936    No results found for: POCLITH, LITHIUM   No results found for: PHENYTOIN, PHENOBARB, VALPROATE, CBMZ   .res Assessment: Plan:   Continue mydayis 37.5 mg qd for ADHD. Pt reports that he had difficulty finding a pharmacy that had Mydayis available. He would like to check with pharmacy that previously had Mydayis available to find out of they have it in stock and can continue to have it available. He reports that he will then call office to notify where he would like scripts sent.  Kyle Scott was seen today for add.  Diagnoses and all orders for this visit:  Attention deficit hyperactivity disorder (ADHD), unspecified ADHD type     Please see After Visit Summary for patient specific instructions.  No future appointments.  No orders of the defined types were placed in this  encounter.     -------------------------------

## 2020-04-06 ENCOUNTER — Telehealth: Payer: Self-pay | Admitting: Psychiatry

## 2020-04-06 ENCOUNTER — Other Ambulatory Visit: Payer: Self-pay

## 2020-04-06 DIAGNOSIS — F909 Attention-deficit hyperactivity disorder, unspecified type: Secondary | ICD-10-CM

## 2020-04-06 MED ORDER — MYDAYIS 37.5 MG PO CP24: 37.5000 mg | ORAL_CAPSULE | Freq: Every morning | ORAL | 0 refills | Status: DC

## 2020-04-06 NOTE — Telephone Encounter (Signed)
Pended for Shanda Bumps to send, added [employer]

## 2020-04-06 NOTE — Telephone Encounter (Signed)
Pt called and said that the pharmacy that has mydayis is [facility] [address] ,[place]. So please send it there

## 2020-04-09 ENCOUNTER — Telehealth: Payer: Self-pay | Admitting: Psychiatry

## 2020-04-09 ENCOUNTER — Other Ambulatory Visit: Payer: Self-pay

## 2020-04-09 DIAGNOSIS — F909 Attention-deficit hyperactivity disorder, unspecified type: Secondary | ICD-10-CM

## 2020-04-09 NOTE — Telephone Encounter (Signed)
Not needed

## 2020-04-10 MED ORDER — MYDAYIS 37.5 MG PO CP24: 37.5000 mg | ORAL_CAPSULE | Freq: Every morning | ORAL | 0 refills | Status: DC

## 2020-05-21 ENCOUNTER — Other Ambulatory Visit: Payer: Self-pay

## 2020-05-21 ENCOUNTER — Telehealth: Payer: Self-pay | Admitting: Psychiatry

## 2020-05-21 DIAGNOSIS — F909 Attention-deficit hyperactivity disorder, unspecified type: Secondary | ICD-10-CM

## 2020-05-21 MED ORDER — MYDAYIS 37.5 MG PO CP24: 37.5000 mg | ORAL_CAPSULE | Freq: Every morning | ORAL | 0 refills | Status: AC

## 2020-05-21 NOTE — Telephone Encounter (Signed)
Last refill 04/12/20 Pended for Shanda Bumps to send

## 2020-05-21 NOTE — Telephone Encounter (Signed)
Pt called and asked for a refill on his mydayis 37.5 mg to the [facility]  [address] in [place]. He has an appt scheduled Friday 07/27/19

## 2020-05-27 ENCOUNTER — Telehealth: Payer: Self-pay | Admitting: Psychiatry

## 2020-05-27 NOTE — Telephone Encounter (Signed)
It was sent on 11/12, has he checked with pharmacy

## 2020-05-27 NOTE — Telephone Encounter (Signed)
Pt called checking status on refill requested 11/12 for Mydayis @ [facility]  [employer] [facility]. [address]

## 2020-06-01 ENCOUNTER — Telehealth: Payer: Self-pay | Admitting: Psychiatry

## 2020-06-01 NOTE — Telephone Encounter (Signed)
Pt LM on VM again. He was asking for the MYDAYIS 37.5 mg Rx to go to [facility] on all the messages. [facility] was called to advise [facility]

## 2020-06-26 IMAGING — CR DG CHEST 2V
2 series · 2 of 2 positions shown · non-contrast
Comparison: None.

CLINICAL DATA: Chest pain

EXAM:
CHEST - 2 VIEW

[chest pa]
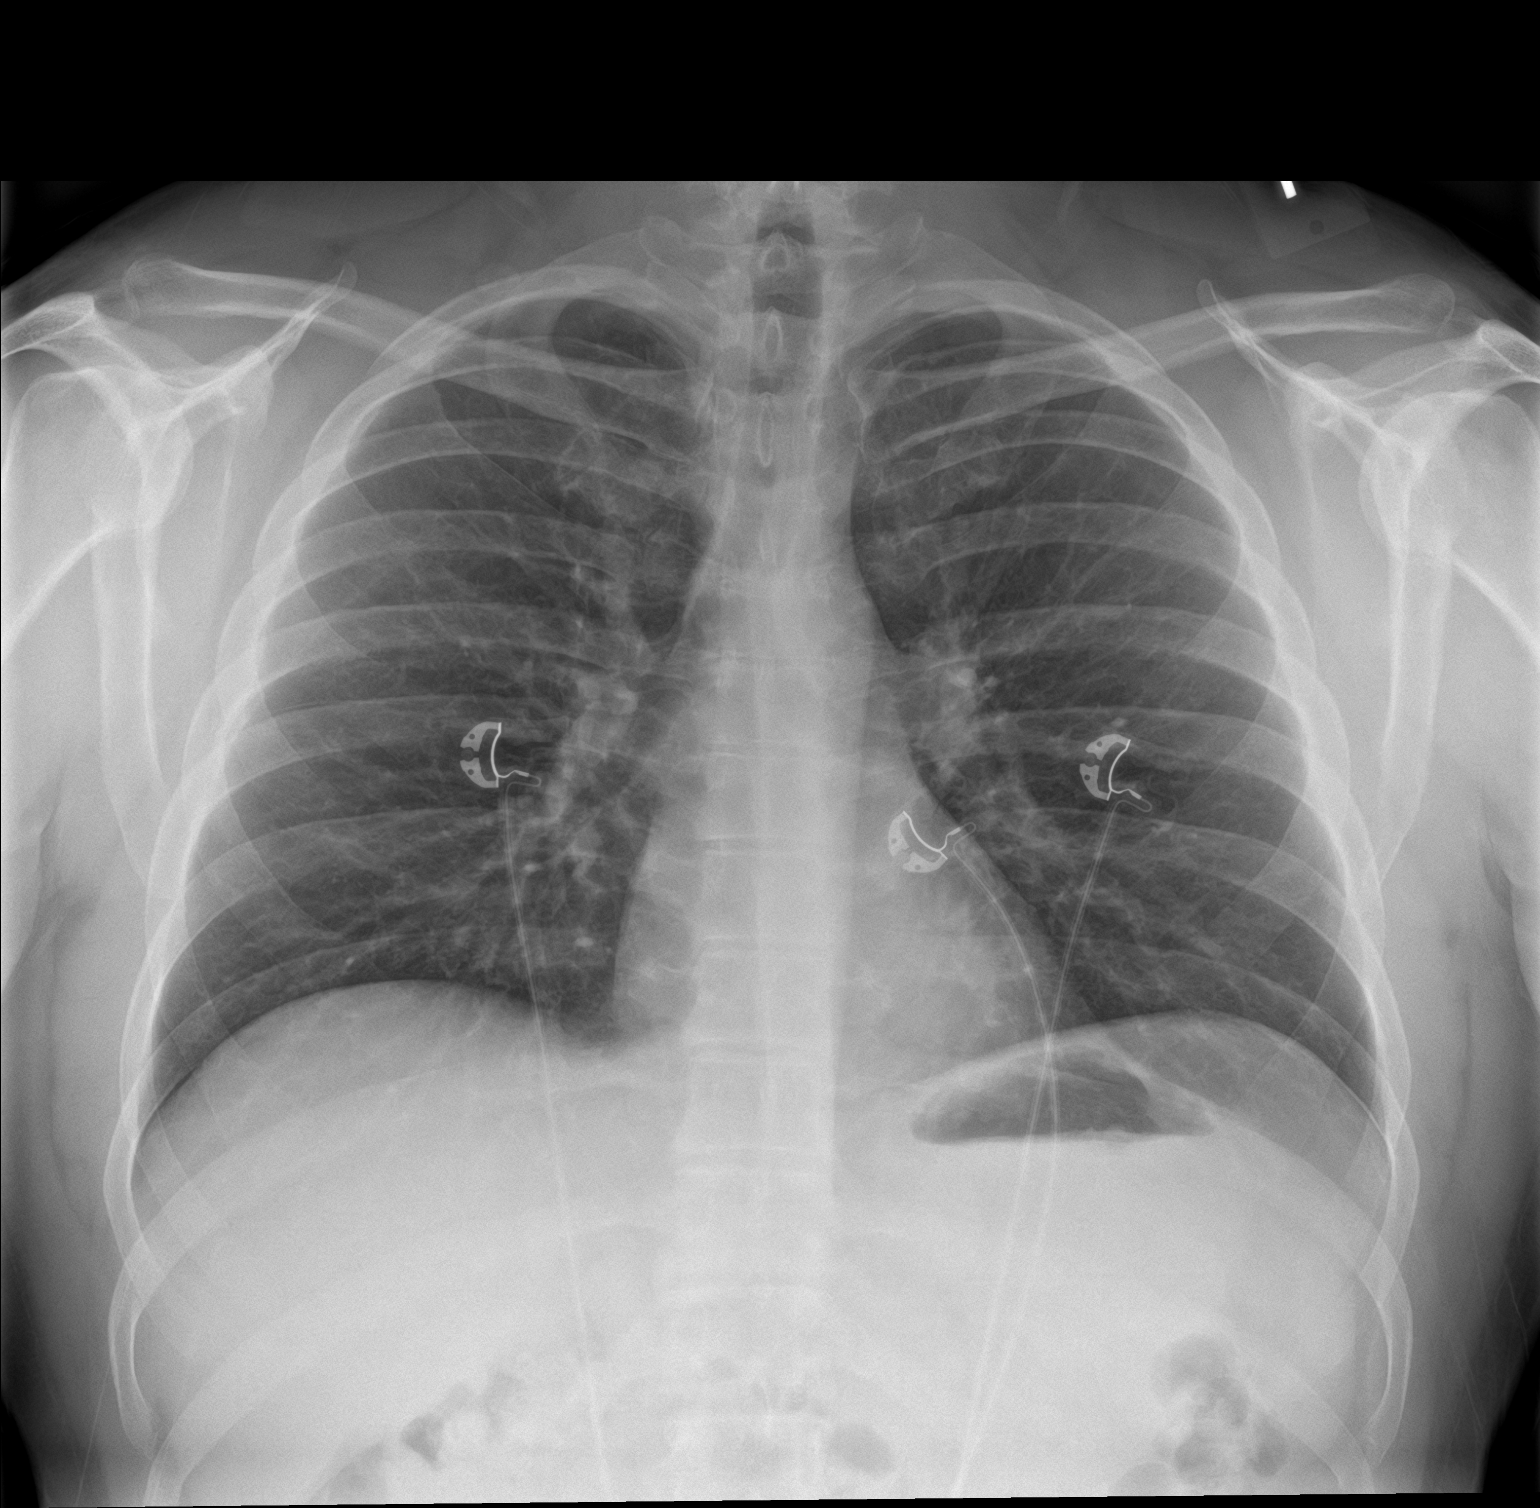

[chest lat]
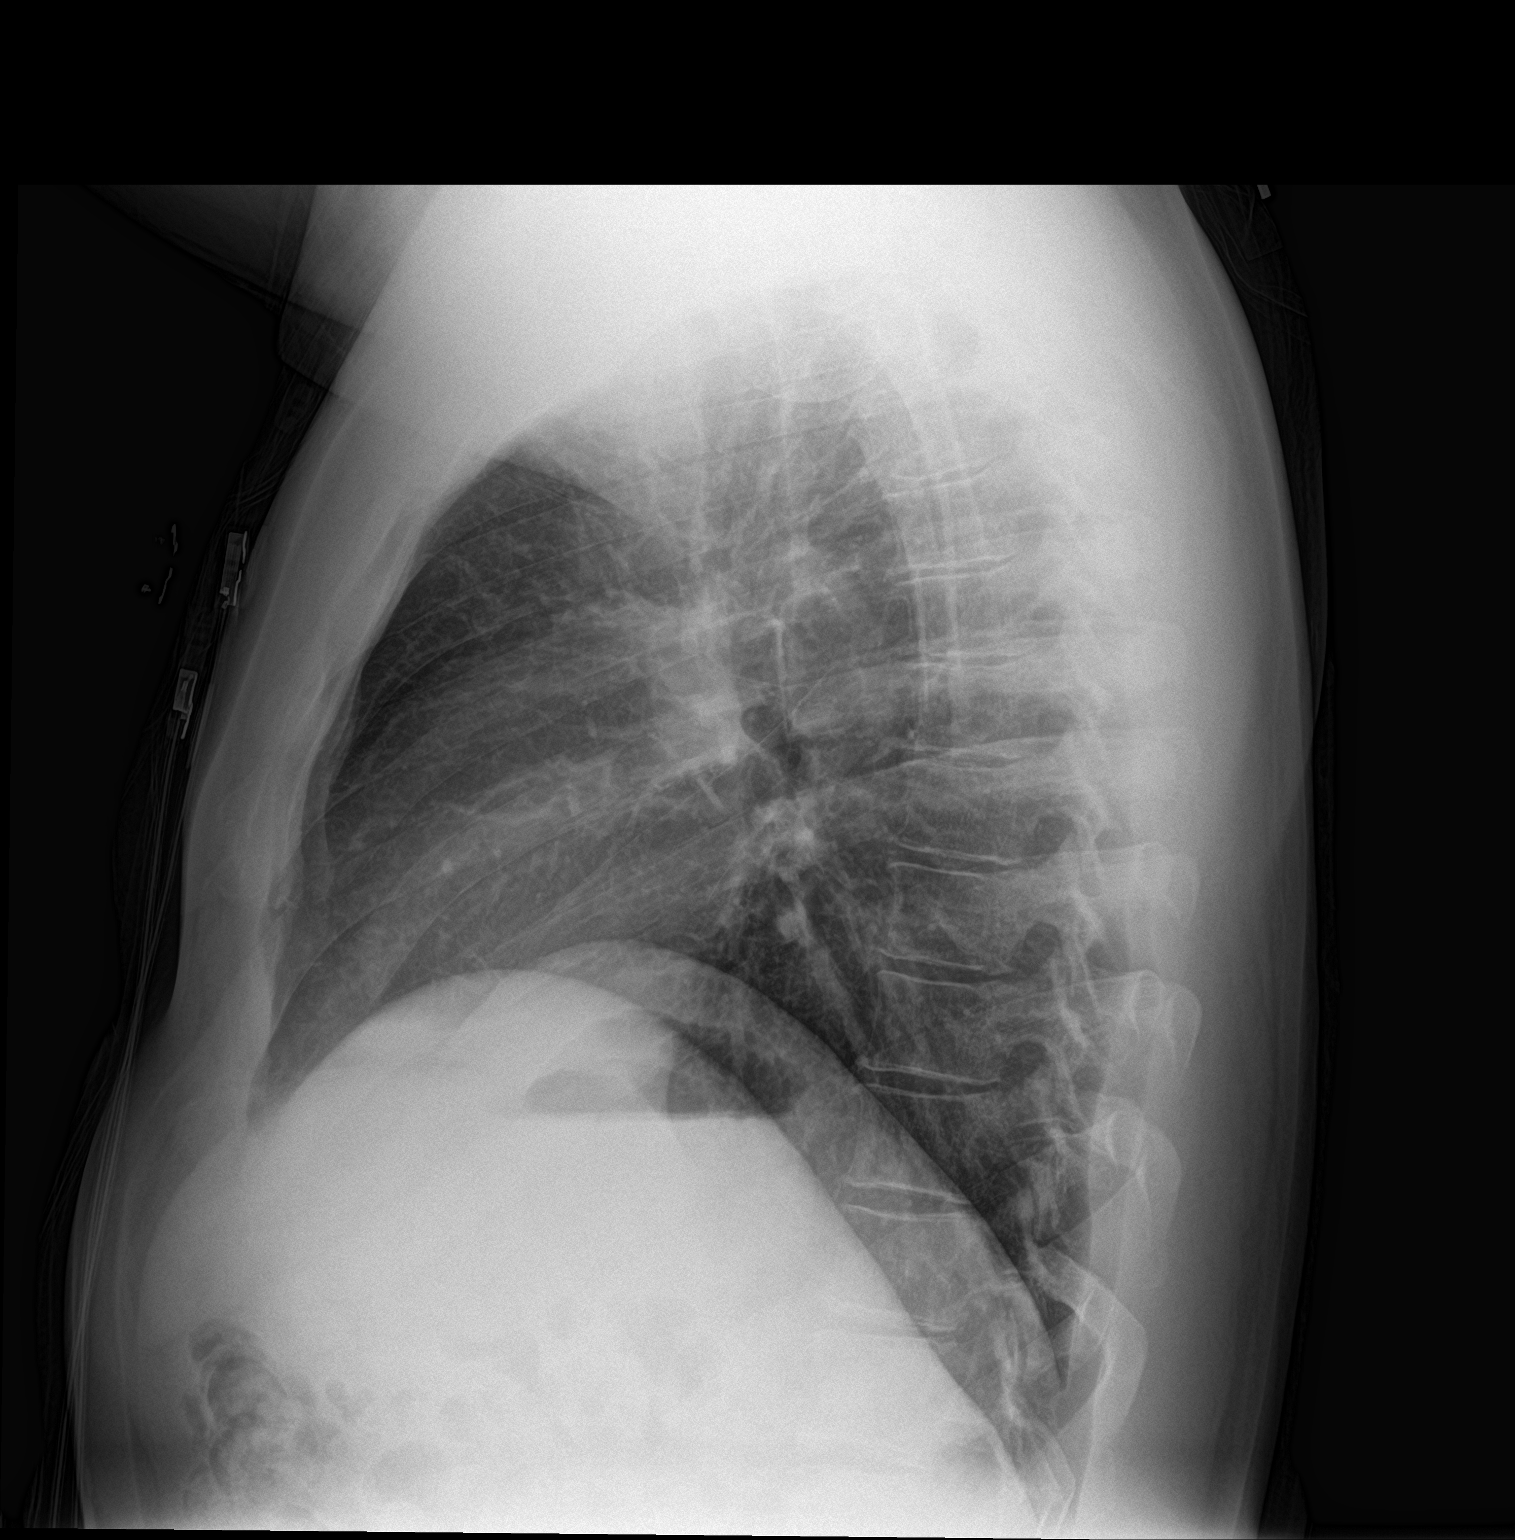

[2 of 2 positions shown; findings below may reference images not displayed]

FINDINGS: Lungs are clear. Heart size and pulmonary vascularity are normal. No
adenopathy. No pneumothorax. No bone lesions.
IMPRESSION: No edema or consolidation.

## 2020-07-16 ENCOUNTER — Ambulatory Visit: Payer: No Typology Code available for payment source | Admitting: Psychiatry

## 2023-05-23 ENCOUNTER — Encounter: Payer: Self-pay | Admitting: Psychiatry
